# Patient Record
Sex: Female | Born: 1995
Health system: Southern US, Community
[De-identification: ages and names within clinical notes are randomized; demographics above are authoritative.]

## PROBLEM LIST (undated history)

## (undated) ENCOUNTER — Inpatient Hospital Stay (HOSPITAL_COMMUNITY): Payer: Self-pay

## (undated) ENCOUNTER — Emergency Department (HOSPITAL_COMMUNITY)

## (undated) ENCOUNTER — Emergency Department (HOSPITAL_COMMUNITY): Admission: EM | Payer: PRIVATE HEALTH INSURANCE | Source: Home / Self Care

## (undated) DIAGNOSIS — N83209 Unspecified ovarian cyst, unspecified side: Secondary | ICD-10-CM

## (undated) DIAGNOSIS — B999 Unspecified infectious disease: Secondary | ICD-10-CM

## (undated) DIAGNOSIS — F419 Anxiety disorder, unspecified: Secondary | ICD-10-CM

## (undated) DIAGNOSIS — R51 Headache: Secondary | ICD-10-CM

## (undated) DIAGNOSIS — A749 Chlamydial infection, unspecified: Secondary | ICD-10-CM

## (undated) DIAGNOSIS — N361 Urethral diverticulum: Secondary | ICD-10-CM

## (undated) DIAGNOSIS — N390 Urinary tract infection, site not specified: Secondary | ICD-10-CM

## (undated) DIAGNOSIS — J309 Allergic rhinitis, unspecified: Secondary | ICD-10-CM

## (undated) HISTORY — DX: Headache: R51

## (undated) HISTORY — PX: FRACTURE SURGERY: SHX138

## (undated) HISTORY — DX: Allergic rhinitis, unspecified: J30.9

---
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
## 2017-01-28 ENCOUNTER — Encounter: Payer: Self-pay | Admitting: Obstetrics

## 2017-01-28 ENCOUNTER — Other Ambulatory Visit (HOSPITAL_COMMUNITY)
Admission: RE | Admit: 2017-01-28 | Discharge: 2017-01-28 | Disposition: A | Discharge status: Home / Self Care | Payer: Commercial Managed Care - PPO | Source: Ambulatory Visit | Attending: Obstetrics | Admitting: Obstetrics

## 2017-01-28 ENCOUNTER — Ambulatory Visit (INDEPENDENT_AMBULATORY_CARE_PROVIDER_SITE_OTHER): Payer: Commercial Managed Care - PPO | Admitting: Obstetrics

## 2017-01-28 VITALS — BP 115/72 | HR 105 | Wt 139.4 lb

## 2017-01-28 DIAGNOSIS — B373 Candidiasis of vulva and vagina: Secondary | ICD-10-CM | POA: Diagnosis not present

## 2017-01-28 DIAGNOSIS — Z3401 Encounter for supervision of normal first pregnancy, first trimester: Secondary | ICD-10-CM

## 2017-01-28 DIAGNOSIS — Z113 Encounter for screening for infections with a predominantly sexual mode of transmission: Secondary | ICD-10-CM | POA: Diagnosis not present

## 2017-01-28 DIAGNOSIS — Z3A13 13 weeks gestation of pregnancy: Secondary | ICD-10-CM | POA: Insufficient documentation

## 2017-01-28 DIAGNOSIS — Z3402 Encounter for supervision of normal first pregnancy, second trimester: Secondary | ICD-10-CM | POA: Diagnosis present

## 2017-01-28 DIAGNOSIS — B9689 Other specified bacterial agents as the cause of diseases classified elsewhere: Secondary | ICD-10-CM | POA: Diagnosis not present

## 2017-01-28 DIAGNOSIS — N898 Other specified noninflammatory disorders of vagina: Secondary | ICD-10-CM

## 2017-01-28 DIAGNOSIS — O23592 Infection of other part of genital tract in pregnancy, second trimester: Secondary | ICD-10-CM | POA: Insufficient documentation

## 2017-01-28 DIAGNOSIS — Z3400 Encounter for supervision of normal first pregnancy, unspecified trimester: Secondary | ICD-10-CM | POA: Insufficient documentation

## 2017-01-28 DIAGNOSIS — O9989 Other specified diseases and conditions complicating pregnancy, childbirth and the puerperium: Secondary | ICD-10-CM | POA: Diagnosis not present

## 2017-01-28 NOTE — Progress Notes (Signed)
Patient is still suffering from nausea and vomiting. Patient has itching- ? Yeast.
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
## 2017-01-29 LAB — CERVICOVAGINAL ANCILLARY ONLY
BACTERIAL VAGINITIS: NEGATIVE
CHLAMYDIA, DNA PROBE: NEGATIVE
Candida vaginitis: POSITIVE — AB
NEISSERIA GONORRHEA: NEGATIVE
TRICH (WINDOWPATH): NEGATIVE

## 2017-01-30 ENCOUNTER — Other Ambulatory Visit: Payer: Self-pay | Admitting: Obstetrics

## 2017-01-30 DIAGNOSIS — B373 Candidiasis of vulva and vagina: Secondary | ICD-10-CM

## 2017-01-30 DIAGNOSIS — B3731 Acute candidiasis of vulva and vagina: Secondary | ICD-10-CM

## 2017-01-30 MED ORDER — TERCONAZOLE 0.8 % VA CREA: 1.0000 | TOPICAL_CREAM | Freq: Every day | VAGINAL | 0 refills | Status: DC

## 2017-02-02 LAB — PRENATAL PROFILE I(LABCORP)
Antibody Screen: NEGATIVE
Basophils Absolute: 0 10*3/uL (ref 0.0–0.2)
Basos: 0 %
EOS (ABSOLUTE): 0.1 10*3/uL (ref 0.0–0.4)
Eos: 1 %
HEMOGLOBIN: 12.6 g/dL (ref 11.1–15.9)
HEP B S AG: NEGATIVE
Hematocrit: 37.9 % (ref 34.0–46.6)
Immature Grans (Abs): 0 10*3/uL (ref 0.0–0.1)
Immature Granulocytes: 0 %
LYMPHS: 14 %
Lymphocytes Absolute: 0.9 10*3/uL (ref 0.7–3.1)
MCH: 29.7 pg (ref 26.6–33.0)
MCHC: 33.2 g/dL (ref 31.5–35.7)
MCV: 89 fL (ref 79–97)
MONOS ABS: 0.5 10*3/uL (ref 0.1–0.9)
Monocytes: 7 %
NEUTROS PCT: 78 %
Neutrophils Absolute: 5.1 10*3/uL (ref 1.4–7.0)
Platelets: 216 10*3/uL (ref 150–379)
RBC: 4.24 x10E6/uL (ref 3.77–5.28)
RDW: 13.9 % (ref 12.3–15.4)
RH TYPE: POSITIVE
RPR: NONREACTIVE
Rubella Antibodies, IGG: 1.3 index (ref >0.99)
WBC: 6.6 10*3/uL (ref 3.4–10.8)

## 2017-02-02 LAB — HEMOGLOBINOPATHY EVALUATION
HEMOGLOBIN F QUANTITATION: 0 % (ref 0.0–2.0)
HGB A: 97.3 % (ref 96.4–98.8)
HGB C: 0 %
HGB S: 0 %
HGB VARIANT: 0 %
Hemoglobin A2 Quantitation: 2.7 % (ref 1.8–3.2)

## 2017-02-02 LAB — VARICELLA ZOSTER ANTIBODY, IGG: Varicella zoster IgG: 646 index (ref >165)

## 2017-02-02 LAB — VITAMIN D 25 HYDROXY (VIT D DEFICIENCY, FRACTURES): VIT D 25 HYDROXY: 10.9 ng/mL — AB (ref 30.0–100.0)

## 2017-02-02 LAB — HIV ANTIBODY (ROUTINE TESTING W REFLEX): HIV SCREEN 4TH GENERATION: NONREACTIVE

## 2017-02-04 ENCOUNTER — Other Ambulatory Visit: Payer: Self-pay | Admitting: Obstetrics

## 2017-02-04 ENCOUNTER — Telehealth: Payer: Self-pay | Admitting: *Deleted

## 2017-02-04 DIAGNOSIS — E559 Vitamin D deficiency, unspecified: Secondary | ICD-10-CM

## 2017-02-04 MED ORDER — VITAMIN D 50 MCG (2000 UT) PO CAPS: 1.0000 | ORAL_CAPSULE | Freq: Every day | ORAL | 5 refills | Status: DC

## 2017-02-04 NOTE — Telephone Encounter (Signed)
Pt was made aware of lab results.  Pt states she was to have Nausea Rx and Zantac Rx refilled. Rx's have not been reordered.    Please send refills for pt.

## 2017-02-05 ENCOUNTER — Other Ambulatory Visit: Payer: Self-pay | Admitting: Obstetrics

## 2017-02-05 DIAGNOSIS — O219 Vomiting of pregnancy, unspecified: Secondary | ICD-10-CM

## 2017-02-05 DIAGNOSIS — K219 Gastro-esophageal reflux disease without esophagitis: Secondary | ICD-10-CM

## 2017-02-05 MED ORDER — RANITIDINE HCL 150 MG PO TABS: 150.0000 mg | ORAL_TABLET | Freq: Two times a day (BID) | ORAL | 11 refills | Status: DC

## 2017-02-05 MED ORDER — DOXYLAMINE-PYRIDOXINE 10-10 MG PO TBEC: DELAYED_RELEASE_TABLET | ORAL | 5 refills | Status: DC

## 2017-02-12 ENCOUNTER — Encounter: Payer: Commercial Managed Care - PPO | Admitting: Obstetrics

## 2017-02-16 NOTE — Progress Notes (Signed)
Left message on VM to call office.

## 2017-02-20 ENCOUNTER — Encounter: Payer: Self-pay | Admitting: Obstetrics and Gynecology

## 2017-02-20 DIAGNOSIS — Z87891 Personal history of nicotine dependence: Secondary | ICD-10-CM | POA: Diagnosis not present

## 2017-02-20 DIAGNOSIS — Z79899 Other long term (current) drug therapy: Secondary | ICD-10-CM | POA: Insufficient documentation

## 2017-02-20 DIAGNOSIS — Z3A17 17 weeks gestation of pregnancy: Secondary | ICD-10-CM | POA: Diagnosis not present

## 2017-02-20 DIAGNOSIS — R112 Nausea with vomiting, unspecified: Secondary | ICD-10-CM | POA: Diagnosis present

## 2017-02-20 LAB — COMPREHENSIVE METABOLIC PANEL
ALBUMIN: 3.8 g/dL (ref 3.5–5.0)
ALK PHOS: 58 U/L (ref 38–126)
ALT: 10 U/L — ABNORMAL LOW (ref 14–54)
AST: 17 U/L (ref 15–41)
Anion gap: 9 (ref 5–15)
BUN: 8 mg/dL (ref 6–20)
CALCIUM: 9.6 mg/dL (ref 8.9–10.3)
CHLORIDE: 103 mmol/L (ref 101–111)
CO2: 23 mmol/L (ref 22–32)
CREATININE: 0.5 mg/dL (ref 0.44–1.00)
GFR calc Af Amer: >60 mL/min (ref >60)
GFR calc non Af Amer: >60 mL/min (ref >60)
GLUCOSE: 78 mg/dL (ref 65–99)
Potassium: 3.7 mmol/L (ref 3.5–5.1)
SODIUM: 135 mmol/L (ref 135–145)
Total Bilirubin: 0.9 mg/dL (ref 0.3–1.2)
Total Protein: 7.1 g/dL (ref 6.5–8.1)

## 2017-02-20 LAB — URINALYSIS, ROUTINE W REFLEX MICROSCOPIC
BILIRUBIN URINE: NEGATIVE
Glucose, UA: NEGATIVE mg/dL
Hgb urine dipstick: NEGATIVE
KETONES UR: 80 mg/dL — AB
Nitrite: NEGATIVE
PH: 6 (ref 5.0–8.0)
Protein, ur: 100 mg/dL — AB
SPECIFIC GRAVITY, URINE: 1.03 (ref 1.005–1.030)

## 2017-02-20 LAB — CBC
HCT: 34.3 % — ABNORMAL LOW (ref 36.0–46.0)
Hemoglobin: 11.9 g/dL — ABNORMAL LOW (ref 12.0–15.0)
MCH: 29.7 pg (ref 26.0–34.0)
MCHC: 34.7 g/dL (ref 30.0–36.0)
MCV: 85.5 fL (ref 78.0–100.0)
PLATELETS: 244 10*3/uL (ref 150–400)
RBC: 4.01 MIL/uL (ref 3.87–5.11)
RDW: 13.9 % (ref 11.5–15.5)
WBC: 6.7 10*3/uL (ref 4.0–10.5)

## 2017-02-20 LAB — LIPASE, BLOOD: Lipase: 24 U/L (ref 11–51)

## 2017-02-20 MED ORDER — ONDANSETRON HCL 4 MG/2ML IJ SOLN: 4.0000 mg | Freq: Once | INTRAMUSCULAR | Status: DC

## 2017-02-20 MED ORDER — SODIUM CHLORIDE 0.9 % IV SOLN
1000.0000 mL | INTRAVENOUS | Status: DC
  Administered 2017-02-21: 1000 mL via INTRAVENOUS

## 2017-02-20 MED ORDER — SODIUM CHLORIDE 0.9 % IV BOLUS (SEPSIS)
1000.0000 mL | Freq: Once | INTRAVENOUS | Status: AC
  Administered 2017-02-21: 1000 mL via INTRAVENOUS

## 2017-02-20 NOTE — ED Triage Notes (Signed)
Pt states that she is [redacted] weeks pregnant and continues to experience nausea and vomiting and heartburn.  She states that she vomited 12-15 times today.  Pt has been having pregnancy related nausea and vomiting intermittently this pregnancy.  This episode has been going on for 3 days.  Pt also reports heartburn.  Pt can feel the baby move, she denies any contractions or leaking of fluids vagnially.

## 2017-02-21 ENCOUNTER — Emergency Department (HOSPITAL_COMMUNITY)
Admission: EM | Admit: 2017-02-21 | Discharge: 2017-02-21 | Disposition: A | Discharge status: Home / Self Care | Payer: Commercial Managed Care - PPO | Attending: Emergency Medicine | Admitting: Emergency Medicine

## 2017-02-21 DIAGNOSIS — R112 Nausea with vomiting, unspecified: Secondary | ICD-10-CM

## 2017-02-21 MED ORDER — ALUM & MAG HYDROXIDE-SIMETH 200-200-20 MG/5ML PO SUSP
30.0000 mL | Freq: Once | ORAL | Status: AC
  Administered 2017-02-21: 30 mL via ORAL
  Filled 2017-02-21: qty 30

## 2017-02-21 MED ORDER — METOCLOPRAMIDE HCL 5 MG/ML IJ SOLN
10.0000 mg | Freq: Once | INTRAMUSCULAR | Status: AC
  Administered 2017-02-21: 10 mg via INTRAVENOUS
  Filled 2017-02-21: qty 2

## 2017-02-21 NOTE — ED Notes (Signed)
Pt stating she wants to leave, despite not having fetal heart tones assessed.

## 2017-02-21 NOTE — ED Notes (Signed)
Gave pt some ice chips and saltine crackers at this time

## 2017-02-21 NOTE — ED Provider Notes (Signed)
TIME SEEN: 1:51 AM  CHIEF COMPLAINT: Nausea, vomiting, heartburn  HPI: Patient is a 21 year old who reports that she is a G1 P0 who is followed by Dr. Clearance Coots who is approximately [redacted] weeks pregnant with an estimated due date of February 3 who presents emergency Department nausea, vomiting for the past 3 days. No diarrhea. Has had some heartburn. Describes the pain as a sharp, burning pain that radiates between her right and left chest and is intermittent. No activating relieving factors. No shortness of breath. No pleuritic pain. She states she is not having a vaginal bleeding, discharge, leaking fluid, contractions. No dysuria or hematuria. She is feeling her baby move. No history of abdominal surgeries. No history of PE or DVT.  ROS: See HPI Constitutional: no fever  Eyes: no drainage  ENT: no runny nose   Cardiovascular:  no chest pain  Resp: no SOB  GI:  vomiting GU: no dysuria Integumentary: no rash  Allergy: no hives  Musculoskeletal: no leg swelling  Neurological: no slurred speech ROS otherwise negative  PAST MEDICAL HISTORY/PAST SURGICAL HISTORY:  Past Medical History:  Diagnosis Date  . Allergic rhinitis    seasonal  . Anxiety   . Chlamydia   . Headache(784.0)   . Infection    UTI    MEDICATIONS:  Prior to Admission medications   Medication Sig Start Date End Date Taking? Authorizing Provider  Cholecalciferol (VITAMIN D) 2000 units CAPS Take 1 capsule (2,000 Units total) by mouth daily. 02/04/17   Brock Bad, MD  Doxylamine-Pyridoxine (DICLEGIS) 10-10 MG TBEC 1 tab in AM, 1 tab mid afternoon 2 tabs at bedtime. Max dose 4 tabs daily. 02/05/17   Brock Bad, MD  promethazine (PHENERGAN) 25 MG tablet Take 1 tablet (25 mg total) by mouth every 6 (six) hours as needed for nausea or vomiting. Patient not taking: Reported on 01/28/2017 12/29/16   Judeth Horn, NP  ranitidine (ZANTAC) 150 MG tablet Take 1 tablet (150 mg total) by mouth 2 (two) times daily. 02/05/17    Brock Bad, MD  terconazole (TERAZOL 3) 0.8 % vaginal cream Place 1 applicator vaginally at bedtime. 01/30/17   Brock Bad, MD    ALLERGIES:  No Known Allergies  SOCIAL HISTORY:  Social History  Substance Use Topics  . Smoking status: Former Smoker    Types: E-cigarettes, Cigarettes  . Smokeless tobacco: Never Used     Comment: June 2018  . Alcohol use No     Comment: rare    FAMILY HISTORY: Family History  Problem Relation Age of Onset  . Migraines Mother   . Migraines Maternal Grandmother   . Cancer Maternal Grandmother        throat  . Diabetes Paternal Grandfather   . Cancer Paternal Grandfather        blood cancer    EXAM: BP 103/61   Pulse (!) 101   Temp 98.1 F (36.7 C) (Oral)   Resp 16   Wt 62.3 kg (137 lb 6.4 oz)   LMP 10/23/2016 (Approximate)   SpO2 98%  CONSTITUTIONAL: Alert and oriented and responds appropriately to questions. Well-appearing; well-nourished HEAD: Normocephalic EYES: Conjunctivae clear, pupils appear equal, EOMI ENT: normal nose; moist mucous membranes NECK: Supple, no meningismus, no nuchal rigidity, no LAD  CARD: RRR; S1 and S2 appreciated; no murmurs, no clicks, no rubs, no gallops RESP: Normal chest excursion without splinting or tachypnea; breath sounds clear and equal bilaterally; no wheezes, no rhonchi, no rales, no hypoxia  or respiratory distress, speaking full sentences ABD/GI: Normal bowel sounds; non-distended; soft, non-tender, no rebound, no guarding, no peritoneal signs, no hepatosplenomegaly, uterus noted above the pubic symphysis but below the umbilicus BACK:  The back appears normal and is non-tender to palpation, there is no CVA tenderness EXT: Normal ROM in all joints; non-tender to palpation; no edema; normal capillary refill; no cyanosis, no calf tenderness or swelling    SKIN: Normal color for age and race; warm; no rash NEURO: Moves all extremities equally PSYCH: The patient's mood and manner are  appropriate. Grooming and personal hygiene are appropriate.  MEDICAL DECISION MAKING: Patient here with nausea and vomiting. Abdominal exam benign. Complains of heartburn. We'll give Reglan, IV fluids and Maalox. She does appear dehydrated on exam. Doubt appendicitis, colitis, bowel obstruction, perforation, diverticulitis. She has no GU complaints. She is feeling her baby move. Labs ordered in triage are unremarkable. She does have large ketones in her urine but no urinary tract infection. We'll give 2 L of fluids and reassess.  ED PROGRESS: Plan was to reassess patient after IV hydration. She was eating and drinking in the room prior to receiving any antiemetics. Unfortunately patient eloped prior to me being able to reassess her and check fetal heart tones.  She did not inform nursing staff that she was going to leave. It was explained to patient that there was delay in reassessment because I was with multiple critical patients. Patient was not provided any prescriptions or discharge information. She does have outpatient OB/GYN follow-up.   I reviewed all nursing notes, vitals, pertinent previous records, EKGs, lab and urine results, imaging (as available).      Ward, Layla Maw, DO 02/21/17 [phone]

## 2017-02-21 NOTE — ED Notes (Signed)
Pt states that she continues to feel nauseated and vomtied x4 out in the waiting area

## 2017-02-21 NOTE — ED Notes (Signed)
Pt left, d/c papers not received yet. Dr. Elesa Massed notified.

## 2017-02-21 NOTE — ED Notes (Signed)
Attempt to doppler fetal heart tones.  Unable, EDP notified and [place] placed at the bedside.  Pt states that she can continue to feel baby move (fluttering)

## 2017-02-25 ENCOUNTER — Encounter: Payer: Self-pay | Admitting: Obstetrics

## 2017-02-25 ENCOUNTER — Ambulatory Visit (INDEPENDENT_AMBULATORY_CARE_PROVIDER_SITE_OTHER): Payer: Commercial Managed Care - PPO | Admitting: Obstetrics

## 2017-02-25 VITALS — BP 115/73 | HR 118 | Wt 137.8 lb

## 2017-02-25 DIAGNOSIS — O219 Vomiting of pregnancy, unspecified: Secondary | ICD-10-CM

## 2017-02-25 DIAGNOSIS — Z3400 Encounter for supervision of normal first pregnancy, unspecified trimester: Secondary | ICD-10-CM

## 2017-02-25 DIAGNOSIS — Z3402 Encounter for supervision of normal first pregnancy, second trimester: Secondary | ICD-10-CM

## 2017-02-25 DIAGNOSIS — G43109 Migraine with aura, not intractable, without status migrainosus: Secondary | ICD-10-CM

## 2017-02-25 MED ORDER — VITAMIN B-6 50 MG PO TABS: 50.0000 mg | ORAL_TABLET | Freq: Two times a day (BID) | ORAL | 5 refills | Status: DC

## 2017-02-25 MED ORDER — DOXYLAMINE SUCCINATE (SLEEP) 25 MG PO TABS: 25.0000 mg | ORAL_TABLET | Freq: Two times a day (BID) | ORAL | 5 refills | Status: DC

## 2017-02-25 NOTE — Patient Instructions (Signed)
Nausea and Vomiting, Adult Feeling sick to your stomach (nausea) means that your stomach is upset or you feel like you have to throw up (vomit). Feeling more and more sick to your stomach can lead to throwing up. Throwing up happens when food and liquid from your stomach are thrown up and out the mouth. Throwing up can make you feel weak and cause you to get dehydrated. Dehydration can make you tired and thirsty, make you have a dry mouth, and make it so you pee (urinate) less often. Older adults and people with other diseases or a weak defense system (immune system) are at higher risk for dehydration. If you feel sick to your stomach or if you throw up, it is important to follow instructions from your doctor about how to take care of yourself. Follow these instructions at home: Eating and drinking Follow these instructions as told by your doctor:  Take an oral rehydration solution (ORS). This is a drink that is sold at pharmacies and stores.  Drink clear fluids in small amounts as you are able, such as: ? Water. ? Ice chips. ? Diluted fruit juice. ? Low-calorie sports drinks.  Eat bland, easy-to-digest foods in small amounts as you are able, such as: ? Bananas. ? Applesauce. ? Rice. ? Low-fat (lean) meats. ? Toast. ? Crackers.  Avoid fluids that have a lot of sugar or caffeine in them.  Avoid alcohol.  Avoid spicy or fatty foods.  General instructions  Drink enough fluid to keep your pee (urine) clear or pale yellow.  Wash your hands often. If you cannot use soap and water, use hand sanitizer.  Make sure that all people in your home wash their hands well and often.  Take over-the-counter and prescription medicines only as told by your doctor.  Rest at home while you get better.  Watch your condition for any changes.  Breathe slowly and deeply when you feel sick to your stomach.  Keep all follow-up visits as told by your doctor. This is important. Contact a doctor  if:  You have a fever.  You cannot keep fluids down.  Your symptoms get worse.  You have new symptoms.  You feel sick to your stomach for more than two days.  You feel light-headed or dizzy.  You have a headache.  You have muscle cramps. Get help right away if:  You have pain in your chest, neck, arm, or jaw.  You feel very weak or you pass out (faint).  You throw up again and again.  You see blood in your throw-up.  Your throw-up looks like black coffee grounds.  You have bloody or black poop (stools) or poop that look like tar.  You have a very bad headache, a stiff neck, or both.  You have a rash.  You have very bad pain, cramping, or bloating in your belly (abdomen).  You have trouble breathing.  You are breathing very quickly.  Your heart is beating very quickly.  Your skin feels cold and clammy.  You feel confused.  You have pain when you pee.  You have signs of dehydration, such as: ? Dark pee, hardly any pee, or no pee. ? Cracked lips. ? Dry mouth. ? Sunken eyes. ? Sleepiness. ? Weakness. These symptoms may be an emergency. Do not wait to see if the symptoms will go away. Get medical help right away. Call your local emergency services (911 in the [place] Do not drive yourself to the hospital. This information is   not intended to replace advice given to you by your health care provider. Make sure you discuss any questions you have with your health care provider. Document Released: 12/02/2007 Document Revised: 01/03/2016 Document Reviewed: 02/19/2015 Elsevier Interactive Patient Education  2018 [employer].  Hyperemesis Gravidarum Hyperemesis gravidarum is a severe form of nausea and vomiting that happens during pregnancy. Hyperemesis is worse than morning sickness. It may cause you to have nausea or vomiting all day for many days. It may keep you from eating and drinking enough food and liquids. Hyperemesis usually occurs during the first half (the  first 20 weeks) of pregnancy. It often goes away once a woman is in her second half of pregnancy. However, sometimes hyperemesis continues through an entire pregnancy. What are the causes? The cause of this condition is not known. It may be related to changes in chemicals (hormones) in the body during pregnancy, such as the high level of pregnancy hormone (human chorionic gonadotropin) or the increase in the female sex hormone (estrogen). What are the signs or symptoms? Symptoms of this condition include:  Severe nausea and vomiting.  Nausea that does not go away.  Vomiting that does not allow you to keep any food down.  Weight loss.  Body fluid loss (dehydration).  Having no desire to eat, or not liking food that you have previously enjoyed.  How is this diagnosed? This condition may be diagnosed based on:  A physical exam.  Your medical history.  Your symptoms.  Blood tests.  Urine tests.  How is this treated? This condition may be managed with medicine. If medicines to do not help relieve nausea and vomiting, you may need to receive fluids through an IV tube at the hospital. Follow these instructions at home:  Take over-the-counter and prescription medicines only as told by your health care provider.  Avoid iron pills and multivitamins that contain iron for the first 3-4 months of pregnancy. If you take prescription iron pills, do not stop taking them unless your health care provider approves.  Take the following actions to help prevent nausea and vomiting: ? In the morning, before getting out of bed, try eating a couple of dry crackers or a piece of toast. ? Avoid foods and smells that upset your stomach. Fatty and spicy foods may make nausea worse. ? Eat 5-6 small meals a day. ? Do not drink fluids while eating meals. Drink between meals. ? Eat or suck on things that have ginger in them. Ginger can help relieve nausea. ? Avoid food preparation. The smell of food can  spoil your appetite or trigger nausea.  Follow instructions from your health care provider about eating or drinking restrictions.  For snacks, eat high-protein foods, such as cheese.  Keep all follow-up and pre-birth (prenatal) visits as told by your health care provider. This is important. Contact a health care provider if:  You have pain in your abdomen.  You have a severe headache.  You have vision problems.  You are losing weight. Get help right away if:  You cannot drink fluids without vomiting.  You vomit blood.  You have constant nausea and vomiting.  You are very weak.  You are very thirsty.  You feel dizzy.  You faint.  You have a fever or other symptoms that last for more than 2-3 days.  You have a fever and your symptoms suddenly get worse. Summary  Hyperemesis gravidarum is a severe form of nausea and vomiting that happens during pregnancy.  Making  some changes to your eating habits may help relieve nausea and vomiting.  This condition may be managed with medicine.  If medicines to do not help relieve nausea and vomiting, you may need to receive fluids through an IV tube at the hospital. This information is not intended to replace advice given to you by your health care provider. Make sure you discuss any questions you have with your health care provider. Document Released: 06/15/2005 Document Revised: 02/12/2016 Document Reviewed: 02/12/2016 Elsevier Interactive Patient Education  2017 [employer]  Eating Plan for Hyperemesis Gravidarum Hyperemesis gravidarum is a severe form of morning sickness. Because this condition causes severe nausea and vomiting, it can lead to dehydration, malnutrition, and weight loss. One way to lessen the symptoms of nausea and vomiting is to follow the eating plan for hyperemesis gravidarum. It is often used along with prescribed medicines to control your symptoms. What can I do to relieve my symptoms? Listen to your  body. Everyone is different and has different preferences. Find what works best for you. Take any of the following actions that are helpful to you:  Eat and drink slowly.  Eat 5-6 small meals daily instead of 3 large meals.  Eat crackers before you get out of bed in the morning.  Try having a snack in the middle of the night.  Starchy foods are usually tolerated well. Examples include cereal, toast, bread, potatoes, pasta, rice, and pretzels.  Ginger may help with nausea. Add  tsp ground ginger to hot tea or choose ginger tea.  Try drinking 100% fruit juice or an electrolyte drink. An electrolyte drink contains sodium, potassium, and chloride.  Continue to take your prenatal vitamins as told by your health care provider. If you are having trouble taking your prenatal vitamins, talk with your health care provider about different options.  Include at least 1 serving of protein with your meals and snacks. Protein options include meats or poultry, beans, nuts, eggs, and yogurt. Try eating a protein-rich snack before bed. Examples of these snacks include cheese and crackers or half of a peanut butter or [place] sandwich.  Consider eliminating foods that trigger your symptoms. These may include spicy foods, coffee, high-fat foods, very sweet foods, and acidic foods.  Try meals that have more protein combined with bland, salty, lower-fat, and dry foods, such as nuts, seeds, pretzels, crackers, and cereal.  Talk with your healthcare provider about starting a supplement of vitamin B6.  Have fluids that are cold, clear, and carbonated or sour. Examples include lemonade, ginger ale, lemon-lime soda, ice water, and sparkling water.  Try lemon or mint tea.  Try brushing your teeth or using a mouth rinse after meals.  What should I avoid to reduce my symptoms? Avoiding some of the following things may help reduce your symptoms.  Foods with strong smells. Try eating meals in well-ventilated areas  that are free of odors.  Drinking water or other beverages with meals. Try not to drink anything during the 30 minutes before and after your meals.  Drinking more than 1 cup of fluid at a time. Sometimes using a straw helps.  Fried or high-fat foods, such as butter and cream sauces.  Spicy foods.  Skipping meals as best as you can. Nausea can be more intense on an empty stomach. If you cannot tolerate food at that time, do not force it. Try sucking on ice chips or other frozen items, and make up for missed calories later.  Lying down within 2  hours after eating.  Environmental triggers. These may include smoky rooms, closed spaces, rooms with strong smells, warm or humid places, overly loud and noisy rooms, and rooms with motion or flickering lights.  Quick and sudden changes in your movement.  This information is not intended to replace advice given to you by your health care provider. Make sure you discuss any questions you have with your health care provider. Document Released: 04/12/2007 Document Revised: 02/12/2016 Document Reviewed: 01/14/2016 Elsevier Interactive Patient Education  [employer].
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
## 2017-02-25 NOTE — Progress Notes (Signed)
Patient reports nausea/vomiting all day. Patient used the medication from the hospital and it has not helped the nausea at all.

## 2017-03-03 LAB — AFP TETRA
DIA Mom Value: 0.83
DIA Value (EIA): 146.53 pg/mL
DSR (By Age)    1 IN: 1153
DSR (SECOND TRIMESTER) 1 IN: 10000
Gestational Age: 17.9 WEEKS
MSAFP MOM: 1.17
MSAFP: 52.3 ng/mL
MSHCG Mom: 1.54
MSHCG: 45516 m[IU]/mL
Maternal Age At EDD: 20.7 yr
Osb Risk: 7137
Test Results:: NEGATIVE
UE3 MOM: 0.89
UE3 VALUE: 1.12 ng/mL
Weight: 137 [lb_av]

## 2017-03-12 ENCOUNTER — Other Ambulatory Visit: Payer: Self-pay | Admitting: Obstetrics

## 2017-03-12 ENCOUNTER — Ambulatory Visit (HOSPITAL_COMMUNITY)
Admission: RE | Admit: 2017-03-12 | Discharge: 2017-03-12 | Disposition: A | Discharge status: Home / Self Care | Payer: PRIVATE HEALTH INSURANCE | Source: Ambulatory Visit | Attending: Obstetrics | Admitting: Obstetrics

## 2017-03-12 DIAGNOSIS — Z3A20 20 weeks gestation of pregnancy: Secondary | ICD-10-CM

## 2017-03-12 DIAGNOSIS — Z369 Encounter for antenatal screening, unspecified: Secondary | ICD-10-CM

## 2017-03-12 DIAGNOSIS — Z3400 Encounter for supervision of normal first pregnancy, unspecified trimester: Secondary | ICD-10-CM

## 2017-03-25 ENCOUNTER — Ambulatory Visit (INDEPENDENT_AMBULATORY_CARE_PROVIDER_SITE_OTHER): Payer: PRIVATE HEALTH INSURANCE | Admitting: Obstetrics & Gynecology

## 2017-03-25 VITALS — BP 128/74 | HR 113 | Wt 149.3 lb

## 2017-03-25 DIAGNOSIS — Z23 Encounter for immunization: Secondary | ICD-10-CM | POA: Diagnosis not present

## 2017-03-25 DIAGNOSIS — Z3400 Encounter for supervision of normal first pregnancy, unspecified trimester: Secondary | ICD-10-CM

## 2017-03-25 DIAGNOSIS — Z3402 Encounter for supervision of normal first pregnancy, second trimester: Secondary | ICD-10-CM
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
## 2017-03-25 NOTE — Progress Notes (Signed)
Patient reports feeling fetal movement, denies pain. Pt states that she is not currently taking PNV because they previously made her sick.

## 2017-04-21 ENCOUNTER — Encounter: Payer: PRIVATE HEALTH INSURANCE | Admitting: Obstetrics

## 2017-05-17 ENCOUNTER — Encounter: Payer: Self-pay | Admitting: Obstetrics

## 2017-05-17 ENCOUNTER — Other Ambulatory Visit: Payer: PRIVATE HEALTH INSURANCE

## 2017-05-17 ENCOUNTER — Ambulatory Visit (INDEPENDENT_AMBULATORY_CARE_PROVIDER_SITE_OTHER): Payer: PRIVATE HEALTH INSURANCE | Admitting: Obstetrics

## 2017-05-17 VITALS — BP 130/76 | HR 103 | Wt 162.6 lb

## 2017-05-17 DIAGNOSIS — Z23 Encounter for immunization: Secondary | ICD-10-CM | POA: Diagnosis not present

## 2017-05-17 DIAGNOSIS — Z3400 Encounter for supervision of normal first pregnancy, unspecified trimester: Secondary | ICD-10-CM

## 2017-05-17 DIAGNOSIS — Z3403 Encounter for supervision of normal first pregnancy, third trimester: Secondary | ICD-10-CM

## 2017-05-17 NOTE — Progress Notes (Signed)
Pt denies issues at this time 

## 2017-05-17 NOTE — Addendum Note (Signed)
Addended by: Lear NgMARTIN, Okie Bogacz L on: 05/17/2017 09:44 AM   Modules accepted: Orders
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
## 2017-05-18 LAB — GLUCOSE TOLERANCE, 2 HOURS W/ 1HR
GLUCOSE, FASTING: 67 mg/dL (ref 65–91)
Glucose, 1 hour: 88 mg/dL (ref 65–179)
Glucose, 2 hour: 76 mg/dL (ref 65–152)

## 2017-05-18 LAB — CBC
Hematocrit: 34.6 % (ref 34.0–46.6)
Hemoglobin: 11.9 g/dL (ref 11.1–15.9)
MCH: 31.3 pg (ref 26.6–33.0)
MCHC: 34.4 g/dL (ref 31.5–35.7)
MCV: 91 fL (ref 79–97)
PLATELETS: 213 10*3/uL (ref 150–379)
RBC: 3.8 x10E6/uL (ref 3.77–5.28)
RDW: 14.3 % (ref 12.3–15.4)
WBC: 9.7 10*3/uL (ref 3.4–10.8)

## 2017-05-18 LAB — RPR: RPR Ser Ql: NONREACTIVE

## 2017-05-18 LAB — HIV ANTIBODY (ROUTINE TESTING W REFLEX): HIV Screen 4th Generation wRfx: NONREACTIVE

## 2017-05-31 ENCOUNTER — Encounter: Payer: PRIVATE HEALTH INSURANCE | Admitting: Obstetrics

## 2017-06-29 NOTE — L&D Delivery Note (Signed)
Delivery Note At 2:40 AM a viable female was delivered via Vaginal, Spontaneous (Presentation: LOA  ).  APGAR: 7, 9; weight pending .   Placenta status: , .  Cord:  with the following complications: .  Cord pH: 7.15  The shoulders were a little slow forthcoming, so the posterior (left) axilla was grasped with my index finger, and the baby was rotated clockwise into the oblique diameter.  At this point, the (now) anterior shoulder was released, and the baby delivered.  At no time was any traction placed on the baby's head. The cord was milked towards the umbilicus d/t slow response to stimulation, clamped and cut. 40 units of pitocin diluted in 1000cc LR was infused rapidly IV.  The placenta separated spontaneously and delivered via CCT and maternal pushing effort.  It was inspected and appears to be intact with a 3 VC. Anesthesia:  epidural Episiotomy:   Lacerations:  none Suture Repair:  Est. Blood Loss (mL): 26  The above was performed by Sherran NeedsS. Abraham, MD under my direct supervision and guidance.    Mom to postpartum.  Baby to Couplet care / Skin to Skin.  CRESENZO-DISHMAN,Yaniah Thiemann 08/06/2017, 2:56 AM

## 2017-07-05 ENCOUNTER — Encounter: Payer: PRIVATE HEALTH INSURANCE | Admitting: Obstetrics

## 2017-07-06 ENCOUNTER — Encounter: Payer: Self-pay | Admitting: Obstetrics

## 2017-07-06 ENCOUNTER — Other Ambulatory Visit (HOSPITAL_COMMUNITY)
Admission: RE | Admit: 2017-07-06 | Discharge: 2017-07-06 | Disposition: A | Discharge status: Home / Self Care | Payer: PRIVATE HEALTH INSURANCE | Source: Ambulatory Visit | Attending: Obstetrics | Admitting: Obstetrics

## 2017-07-06 ENCOUNTER — Ambulatory Visit (INDEPENDENT_AMBULATORY_CARE_PROVIDER_SITE_OTHER): Payer: PRIVATE HEALTH INSURANCE | Admitting: Obstetrics

## 2017-07-06 VITALS — BP 107/70 | HR 108 | Wt 165.4 lb

## 2017-07-06 DIAGNOSIS — O368130 Decreased fetal movements, third trimester, not applicable or unspecified: Secondary | ICD-10-CM

## 2017-07-06 DIAGNOSIS — Z3400 Encounter for supervision of normal first pregnancy, unspecified trimester: Secondary | ICD-10-CM | POA: Insufficient documentation

## 2017-07-06 DIAGNOSIS — Z3403 Encounter for supervision of normal first pregnancy, third trimester: Secondary | ICD-10-CM

## 2017-07-06 LAB — OB RESULTS CONSOLE GC/CHLAMYDIA: GC PROBE AMP, GENITAL: NEGATIVE

## 2017-07-06 NOTE — Progress Notes (Signed)
Patient reports feeling a decrease in fetal movement from a couple of days ago. Pt reports feeling some pressure, denies contractions and bleeding.
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
## 2017-07-07 LAB — CERVICOVAGINAL ANCILLARY ONLY
CHLAMYDIA, DNA PROBE: NEGATIVE
Neisseria Gonorrhea: NEGATIVE

## 2017-07-08 LAB — STREP GP B NAA: STREP GROUP B AG: POSITIVE — AB

## 2017-07-13 ENCOUNTER — Encounter: Payer: PRIVATE HEALTH INSURANCE | Admitting: Obstetrics

## 2017-07-29 ENCOUNTER — Encounter: Payer: Self-pay | Admitting: Obstetrics

## 2017-07-29 ENCOUNTER — Other Ambulatory Visit: Payer: Self-pay

## 2017-07-29 ENCOUNTER — Ambulatory Visit (INDEPENDENT_AMBULATORY_CARE_PROVIDER_SITE_OTHER): Payer: PRIVATE HEALTH INSURANCE | Admitting: Obstetrics

## 2017-07-29 DIAGNOSIS — Z3403 Encounter for supervision of normal first pregnancy, third trimester: Secondary | ICD-10-CM

## 2017-07-29 DIAGNOSIS — Z3400 Encounter for supervision of normal first pregnancy, unspecified trimester: Secondary | ICD-10-CM
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
## 2017-07-30 ENCOUNTER — Telehealth (HOSPITAL_COMMUNITY): Payer: Self-pay | Admitting: *Deleted

## 2017-07-30 NOTE — Telephone Encounter (Signed)
Preadmission screen  

## 2017-08-03 ENCOUNTER — Telehealth (HOSPITAL_COMMUNITY): Payer: Self-pay | Admitting: *Deleted

## 2017-08-03 ENCOUNTER — Other Ambulatory Visit: Payer: Self-pay | Admitting: Obstetrics and Gynecology

## 2017-08-03 NOTE — Telephone Encounter (Signed)
Preadmission screen  

## 2017-08-05 ENCOUNTER — Encounter: Payer: PRIVATE HEALTH INSURANCE | Admitting: Obstetrics

## 2017-08-05 ENCOUNTER — Inpatient Hospital Stay (HOSPITAL_COMMUNITY): Payer: PRIVATE HEALTH INSURANCE | Admitting: Anesthesiology

## 2017-08-05 ENCOUNTER — Inpatient Hospital Stay (HOSPITAL_COMMUNITY)
Admission: AD | Admit: 2017-08-05 | Discharge: 2017-08-08 | DRG: 807 | Disposition: A | Discharge status: Home / Self Care | Payer: PRIVATE HEALTH INSURANCE | Source: Ambulatory Visit | Attending: Obstetrics & Gynecology | Admitting: Obstetrics & Gynecology

## 2017-08-05 ENCOUNTER — Encounter (HOSPITAL_COMMUNITY): Payer: Self-pay

## 2017-08-05 ENCOUNTER — Other Ambulatory Visit: Payer: Self-pay

## 2017-08-05 DIAGNOSIS — Z87891 Personal history of nicotine dependence: Secondary | ICD-10-CM

## 2017-08-05 DIAGNOSIS — G43009 Migraine without aura, not intractable, without status migrainosus: Secondary | ICD-10-CM | POA: Diagnosis present

## 2017-08-05 DIAGNOSIS — O99824 Streptococcus B carrier state complicating childbirth: Secondary | ICD-10-CM | POA: Diagnosis present

## 2017-08-05 DIAGNOSIS — O26893 Other specified pregnancy related conditions, third trimester: Secondary | ICD-10-CM | POA: Diagnosis present

## 2017-08-05 DIAGNOSIS — Z3400 Encounter for supervision of normal first pregnancy, unspecified trimester: Secondary | ICD-10-CM

## 2017-08-05 DIAGNOSIS — Z3A40 40 weeks gestation of pregnancy: Secondary | ICD-10-CM | POA: Diagnosis not present

## 2017-08-05 DIAGNOSIS — O480 Post-term pregnancy: Principal | ICD-10-CM | POA: Diagnosis present

## 2017-08-05 LAB — TYPE AND SCREEN
ABO/RH(D): A POS
ANTIBODY SCREEN: NEGATIVE

## 2017-08-05 LAB — ABO/RH: ABO/RH(D): A POS

## 2017-08-05 LAB — CBC
HCT: 35.6 % — ABNORMAL LOW (ref 36.0–46.0)
HEMOGLOBIN: 12.1 g/dL (ref 12.0–15.0)
MCH: 29.4 pg (ref 26.0–34.0)
MCHC: 34 g/dL (ref 30.0–36.0)
MCV: 86.6 fL (ref 78.0–100.0)
PLATELETS: 261 10*3/uL (ref 150–400)
RBC: 4.11 MIL/uL (ref 3.87–5.11)
RDW: 13.8 % (ref 11.5–15.5)
WBC: 11.3 10*3/uL — ABNORMAL HIGH (ref 4.0–10.5)

## 2017-08-05 MED ORDER — FENTANYL 2.5 MCG/ML BUPIVACAINE 1/10 % EPIDURAL INFUSION (WH - ANES)
INTRAMUSCULAR | Status: AC
  Filled 2017-08-05: qty 100

## 2017-08-05 MED ORDER — OXYCODONE-ACETAMINOPHEN 5-325 MG PO TABS: 2.0000 | ORAL_TABLET | ORAL | Status: DC | PRN

## 2017-08-05 MED ORDER — PHENYLEPHRINE 40 MCG/ML (10ML) SYRINGE FOR IV PUSH (FOR BLOOD PRESSURE SUPPORT): 80.0000 ug | PREFILLED_SYRINGE | INTRAVENOUS | Status: DC | PRN

## 2017-08-05 MED ORDER — OXYTOCIN 40 UNITS IN LACTATED RINGERS INFUSION - SIMPLE MED
1.0000 m[IU]/min | INTRAVENOUS | Status: DC
  Administered 2017-08-05: 2 m[IU]/min via INTRAVENOUS

## 2017-08-05 MED ORDER — LIDOCAINE HCL (PF) 1 % IJ SOLN
30.0000 mL | INTRAMUSCULAR | Status: DC | PRN
  Filled 2017-08-05: qty 30

## 2017-08-05 MED ORDER — PHENYLEPHRINE 40 MCG/ML (10ML) SYRINGE FOR IV PUSH (FOR BLOOD PRESSURE SUPPORT)
PREFILLED_SYRINGE | INTRAVENOUS | Status: AC
  Filled 2017-08-05: qty 20

## 2017-08-05 MED ORDER — LACTATED RINGERS IV SOLN: 500.0000 mL | INTRAVENOUS | Status: DC | PRN

## 2017-08-05 MED ORDER — FLEET ENEMA 7-19 GM/118ML RE ENEM: 1.0000 | ENEMA | RECTAL | Status: DC | PRN

## 2017-08-05 MED ORDER — LACTATED RINGERS IV SOLN: 500.0000 mL | Freq: Once | INTRAVENOUS | Status: DC

## 2017-08-05 MED ORDER — PENICILLIN G POTASSIUM 5000000 UNITS IJ SOLR
5.0000 10*6.[IU] | Freq: Once | INTRAVENOUS | Status: AC
  Administered 2017-08-05: 5 10*6.[IU] via INTRAVENOUS
  Filled 2017-08-05: qty 5

## 2017-08-05 MED ORDER — EPHEDRINE 5 MG/ML INJ: 10.0000 mg | INTRAVENOUS | Status: DC | PRN

## 2017-08-05 MED ORDER — OXYTOCIN BOLUS FROM INFUSION
500.0000 mL | Freq: Once | INTRAVENOUS | Status: AC
  Administered 2017-08-06: 500 mL via INTRAVENOUS

## 2017-08-05 MED ORDER — TERBUTALINE SULFATE 1 MG/ML IJ SOLN: 0.2500 mg | Freq: Once | INTRAMUSCULAR | Status: DC | PRN

## 2017-08-05 MED ORDER — ACETAMINOPHEN 325 MG PO TABS: 650.0000 mg | ORAL_TABLET | ORAL | Status: DC | PRN

## 2017-08-05 MED ORDER — SOD CITRATE-CITRIC ACID 500-334 MG/5ML PO SOLN: 30.0000 mL | ORAL | Status: DC | PRN

## 2017-08-05 MED ORDER — LIDOCAINE HCL (PF) 1 % IJ SOLN
INTRAMUSCULAR | Status: DC | PRN
  Administered 2017-08-05: 3 mL via EPIDURAL
  Administered 2017-08-05: 5 mL via EPIDURAL
  Administered 2017-08-05: 2 mL via EPIDURAL

## 2017-08-05 MED ORDER — DIPHENHYDRAMINE HCL 50 MG/ML IJ SOLN: 12.5000 mg | INTRAMUSCULAR | Status: DC | PRN

## 2017-08-05 MED ORDER — OXYCODONE-ACETAMINOPHEN 5-325 MG PO TABS: 1.0000 | ORAL_TABLET | ORAL | Status: DC | PRN

## 2017-08-05 MED ORDER — FENTANYL 2.5 MCG/ML BUPIVACAINE 1/10 % EPIDURAL INFUSION (WH - ANES)
14.0000 mL/h | INTRAMUSCULAR | Status: DC | PRN
  Administered 2017-08-05: 12 mL/h via EPIDURAL

## 2017-08-05 MED ORDER — FENTANYL CITRATE (PF) 100 MCG/2ML IJ SOLN
100.0000 ug | INTRAMUSCULAR | Status: DC | PRN
  Administered 2017-08-05 (×4): 100 ug via INTRAVENOUS
  Filled 2017-08-05 (×4): qty 2

## 2017-08-05 MED ORDER — ONDANSETRON HCL 4 MG/2ML IJ SOLN: 4.0000 mg | Freq: Four times a day (QID) | INTRAMUSCULAR | Status: DC | PRN

## 2017-08-05 MED ORDER — OXYTOCIN 40 UNITS IN LACTATED RINGERS INFUSION - SIMPLE MED
2.5000 [IU]/h | INTRAVENOUS | Status: DC
  Administered 2017-08-06: 2.5 [IU]/h via INTRAVENOUS
  Filled 2017-08-05: qty 1000

## 2017-08-05 MED ORDER — LACTATED RINGERS IV SOLN
INTRAVENOUS | Status: DC
  Administered 2017-08-05 – 2017-08-06 (×2): via INTRAVENOUS

## 2017-08-05 MED ORDER — PENICILLIN G POT IN DEXTROSE 60000 UNIT/ML IV SOLN
3.0000 10*6.[IU] | INTRAVENOUS | Status: DC
  Administered 2017-08-05 (×2): 3 10*6.[IU] via INTRAVENOUS
  Filled 2017-08-05 (×5): qty 50

## 2017-08-05 NOTE — Progress Notes (Signed)
Christina Kennedy is a 22 y.o. G1P0 at 2335w6d by LMP admitted for induction of labor due to Post dates. Due date 07/30/17.  Subjective: Patient doing well.   Objective: BP 94/80   Pulse (!) 105   Temp 98.9 F (37.2 C) (Oral)   Resp 16   Ht 5' (1.524 m)   Wt 78 kg (172 lb)   LMP 10/23/2016 (Approximate)   SpO2 98%   BMI 33.59 kg/m  No intake/output data recorded. No intake/output data recorded.  FHT:  FHR: 135 bpm, variability: moderate,  accelerations:  Present,  decelerations:  Absent UC:   Irregular SVE:   Dilation: 7.5 Effacement (%): 90 Station: -1 Exam by:: Christina Kennedy,Christina Kennedy  Labs: Lab Results  Component Value Date   WBC 11.3 (H) 08/05/2017   HGB 12.1 08/05/2017   HCT 35.6 (L) 08/05/2017   MCV 86.6 08/05/2017   PLT 261 08/05/2017    Assessment / Plan: Induction of labor due to postterm,  progressing well on pitocin  Labor: Pitocin @10miliunits /min. AROM @2146  by Christina Kennedy, Christina Kennedy Fetal Wellbeing:  Category I Pain Control:  per patient request I/D:  GBS pos - PCN Anticipated MOD:  NSVD  Christina ManisSherin Henessy Rohrer, DO PGY-1 08/05/2017, 9:52 PM

## 2017-08-05 NOTE — Anesthesia Pain Management Evaluation Note (Signed)
  CRNA Pain Management Visit Note  Patient: Christina DominoMelanie Kindig, 22 y.o., female  "Hello I am a member of the anesthesia team at [facility]. We have an anesthesia team available at all times to provide care throughout the hospital, including epidural management and anesthesia for C-section. I don't know your plan for the delivery whether it a natural birth, water birth, IV sedation, nitrous supplementation, doula or epidural, but we want to meet your pain goals."   1.Was your pain managed to your expectations on prior hospitalizations?   No prior hospitalizations  2.What is your expectation for pain management during this hospitalization?     IV pain meds  3.How can we help you reach that goal?   Record the patient's initial score and the patient's pain goal.   Pain: 8  Pain Goal: 9 [facility] wants you to be able to say your pain was always managed very well.  Laban EmperorMalinova,Dammon Makarewicz Hristova 08/05/2017

## 2017-08-05 NOTE — Progress Notes (Signed)
Christina Kennedy is a 22 y.o. G1P0 at 4572w6d by LMP admitted for induction of labor due to Post dates. Due date 07/30/17.  Subjective: Patient doing well, no concerns or questions at this time. Family and FOB at bedside.   Objective: BP (!) 117/59 (BP Location: Left Arm)   Pulse (!) 108   Temp 98.9 F (37.2 C) (Oral)   Resp 18   Ht 5' (1.524 m)   Wt 78 kg (172 lb)   LMP 10/23/2016 (Approximate)   SpO2 98%   BMI 33.59 kg/m  No intake/output data recorded. No intake/output data recorded.  FHT:  FHR: 130 bpm, variability: moderate,  accelerations:  Present,  decelerations:  Absent UC:   irregular, every 1-4 minutes SVE:   Dilation: 5.5 Effacement (%): 80 Station: -1 Exam by:: C. Goodman(verified by E. Foley, RN)  Labs: Lab Results  Component Value Date   WBC 11.3 (H) 08/05/2017   HGB 12.1 08/05/2017   HCT 35.6 (L) 08/05/2017   MCV 86.6 08/05/2017   PLT 261 08/05/2017    Assessment / Plan: Induction of labor due to postterm,  progressing well on pitocin  Labor: Pitocin @6miliunits /min. Will continue to increase per protocol. Plan to AROM Fetal Wellbeing:  Category I Pain Control:  per patient request I/D:  GBS pos - PCN Anticipated MOD:  NSVD  Christina ManisSherin Jarquis Walker, DO PGY-1 08/05/2017, 8:54 PM

## 2017-08-05 NOTE — Anesthesia Procedure Notes (Signed)
Epidural Patient location during procedure: OB Start time: 08/05/2017 11:34 PM End time: 08/05/2017 11:39 PM  Staffing Anesthesiologist: Cecile Hearingurk, Anetra Czerwinski Edward, MD Performed: anesthesiologist   Preanesthetic Checklist Completed: patient identified, pre-op evaluation, timeout performed, IV checked, risks and benefits discussed and monitors and equipment checked  Epidural Patient position: sitting Prep: DuraPrep Patient monitoring: blood pressure and continuous pulse ox Approach: midline Location: L3-L4 Injection technique: LOR air  Needle:  Needle type: Tuohy  Needle gauge: 17 G Needle length: 9 cm Needle insertion depth: 4 cm Catheter size: 19 Gauge Catheter at skin depth: 9 cm Test dose: negative and Other (1% Lidocaine)  Additional Notes Patient identified.  Risk benefits discussed including failed block, incomplete pain control, headache, nerve damage, paralysis, blood pressure changes, nausea, vomiting, reactions to medication both toxic or allergic, and postpartum back pain.  Patient expressed understanding and wished to proceed.  All questions were answered.  Sterile technique used throughout procedure and epidural site dressed with sterile barrier dressing. No paresthesia or other complications noted. The patient did not experience any signs of intravascular injection such as tinnitus or metallic taste in mouth nor signs of intrathecal spread such as rapid motor block. Please see nursing notes for vital signs. Reason for block:procedure for pain

## 2017-08-05 NOTE — MAU Note (Signed)
Pt has been contracting since 7am, 5 min apart. No LOF no bleeding +FM

## 2017-08-05 NOTE — Progress Notes (Signed)
Vitals:   08/05/17 1900 08/05/17 1918  BP: 130/90 (!) 117/59  Pulse: (!) 102 (!) 108  Resp: 18 18  Temp:  98.9 F (37.2 C)  SpO2:     Foley fell out, pit at 3 mu/min.  Ctx q 4-5 minutes, cx 5-6 cms. Continue present mgt.

## 2017-08-05 NOTE — Anesthesia Preprocedure Evaluation (Signed)
Anesthesia Evaluation  Patient identified by MRN, date of birth, ID band Patient awake    Reviewed: Allergy & Precautions, NPO status , Patient's Chart, lab work & pertinent test results  Airway Mallampati: II  TM Distance: >3 FB Neck ROM: Full    Dental  (+) Teeth Intact, Dental Advisory Given   Pulmonary former smoker,    Pulmonary exam normal breath sounds clear to auscultation       Cardiovascular Exercise Tolerance: Good negative cardio ROS Normal cardiovascular exam Rhythm:Regular Rate:Normal     Neuro/Psych  Headaches, Seizures -,  PSYCHIATRIC DISORDERS Anxiety    GI/Hepatic Neg liver ROS, GERD  Medicated,  Endo/Other  negative endocrine ROSObesity   Renal/GU negative Renal ROS     Musculoskeletal negative musculoskeletal ROS (+)   Abdominal   Peds  Hematology negative hematology ROS (+) Plt 261k   Anesthesia Other Findings Day of surgery medications reviewed with the patient.  Reproductive/Obstetrics (+) Pregnancy                             Anesthesia Physical Anesthesia Plan  ASA: II  Anesthesia Plan: Epidural   Post-op Pain Management:    Induction:   PONV Risk Score and Plan: 2 and Treatment may vary due to age or medical condition  Airway Management Planned:   Additional Equipment:   Intra-op Plan:   Post-operative Plan:   Informed Consent: I have reviewed the patients History and Physical, chart, labs and discussed the procedure including the risks, benefits and alternatives for the proposed anesthesia with the patient or authorized representative who has indicated his/her understanding and acceptance.   Dental advisory given  Plan Discussed with:   Anesthesia Plan Comments: (Patient identified. Risks/Benefits/Options discussed with patient including but not limited to bleeding, infection, nerve damage, paralysis, failed block, incomplete pain control,  headache, blood pressure changes, nausea, vomiting, reactions to medication both or allergic, itching and postpartum back pain. Confirmed with bedside nurse the patient's most recent platelet count. Confirmed with patient that they are not currently taking any anticoagulation, have any bleeding history or any family history of bleeding disorders. Patient expressed understanding and wished to proceed. All questions were answered. )        Anesthesia Quick Evaluation

## 2017-08-05 NOTE — MAU Note (Signed)
Urine in lab 
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
## 2017-08-06 ENCOUNTER — Encounter (HOSPITAL_COMMUNITY): Payer: Self-pay

## 2017-08-06 DIAGNOSIS — Z3A40 40 weeks gestation of pregnancy: Secondary | ICD-10-CM

## 2017-08-06 DIAGNOSIS — O99824 Streptococcus B carrier state complicating childbirth: Secondary | ICD-10-CM

## 2017-08-06 LAB — RPR: RPR Ser Ql: NONREACTIVE

## 2017-08-06 MED ORDER — METHYLERGONOVINE MALEATE 0.2 MG/ML IJ SOLN: 0.2000 mg | INTRAMUSCULAR | Status: DC | PRN

## 2017-08-06 MED ORDER — ACETAMINOPHEN 325 MG PO TABS
650.0000 mg | ORAL_TABLET | ORAL | Status: DC | PRN
  Administered 2017-08-06: 650 mg via ORAL
  Filled 2017-08-06: qty 2

## 2017-08-06 MED ORDER — DIBUCAINE 1 % RE OINT
1.0000 "application " | TOPICAL_OINTMENT | RECTAL | Status: DC | PRN
  Filled 2017-08-06: qty 28

## 2017-08-06 MED ORDER — TETANUS-DIPHTH-ACELL PERTUSSIS 5-2.5-18.5 LF-MCG/0.5 IM SUSP: 0.5000 mL | Freq: Once | INTRAMUSCULAR | Status: DC

## 2017-08-06 MED ORDER — SIMETHICONE 80 MG PO CHEW: 80.0000 mg | CHEWABLE_TABLET | ORAL | Status: DC | PRN

## 2017-08-06 MED ORDER — ONDANSETRON HCL 4 MG/2ML IJ SOLN: 4.0000 mg | INTRAMUSCULAR | Status: DC | PRN

## 2017-08-06 MED ORDER — PRENATAL MULTIVITAMIN CH
1.0000 | ORAL_TABLET | Freq: Every day | ORAL | Status: DC
  Administered 2017-08-06 – 2017-08-08 (×3): 1 via ORAL
  Filled 2017-08-06 (×3): qty 1

## 2017-08-06 MED ORDER — METHYLERGONOVINE MALEATE 0.2 MG PO TABS: 0.2000 mg | ORAL_TABLET | ORAL | Status: DC | PRN

## 2017-08-06 MED ORDER — DOCUSATE SODIUM 100 MG PO CAPS
100.0000 mg | ORAL_CAPSULE | Freq: Two times a day (BID) | ORAL | Status: DC
  Administered 2017-08-07 – 2017-08-08 (×4): 100 mg via ORAL
  Filled 2017-08-06 (×4): qty 1

## 2017-08-06 MED ORDER — FERROUS SULFATE 325 (65 FE) MG PO TABS
325.0000 mg | ORAL_TABLET | Freq: Two times a day (BID) | ORAL | Status: DC
  Administered 2017-08-06 – 2017-08-08 (×4): 325 mg via ORAL
  Filled 2017-08-06 (×5): qty 1

## 2017-08-06 MED ORDER — WITCH HAZEL-GLYCERIN EX PADS: 1.0000 "application " | MEDICATED_PAD | CUTANEOUS | Status: DC | PRN

## 2017-08-06 MED ORDER — IBUPROFEN 600 MG PO TABS
600.0000 mg | ORAL_TABLET | Freq: Four times a day (QID) | ORAL | Status: DC
  Administered 2017-08-06 – 2017-08-08 (×10): 600 mg via ORAL
  Filled 2017-08-06 (×10): qty 1

## 2017-08-06 MED ORDER — DIPHENHYDRAMINE HCL 25 MG PO CAPS: 25.0000 mg | ORAL_CAPSULE | Freq: Four times a day (QID) | ORAL | Status: DC | PRN

## 2017-08-06 MED ORDER — MEASLES, MUMPS & RUBELLA VAC ~~LOC~~ INJ
0.5000 mL | INJECTION | Freq: Once | SUBCUTANEOUS | Status: DC
  Filled 2017-08-06: qty 0.5

## 2017-08-06 MED ORDER — ONDANSETRON HCL 4 MG PO TABS: 4.0000 mg | ORAL_TABLET | ORAL | Status: DC | PRN

## 2017-08-06 MED ORDER — BISACODYL 10 MG RE SUPP
10.0000 mg | Freq: Every day | RECTAL | Status: DC | PRN
  Filled 2017-08-06: qty 1

## 2017-08-06 MED ORDER — COCONUT OIL OIL
1.0000 "application " | TOPICAL_OIL | Status: DC | PRN
  Administered 2017-08-07: 1 via TOPICAL
  Filled 2017-08-06 (×2): qty 120

## 2017-08-06 MED ORDER — BENZOCAINE-MENTHOL 20-0.5 % EX AERO
1.0000 "application " | INHALATION_SPRAY | CUTANEOUS | Status: DC | PRN
  Filled 2017-08-06: qty 56

## 2017-08-06 MED ORDER — FLEET ENEMA 7-19 GM/118ML RE ENEM: 1.0000 | ENEMA | Freq: Every day | RECTAL | Status: DC | PRN

## 2017-08-06 MED ORDER — ZOLPIDEM TARTRATE 5 MG PO TABS
5.0000 mg | ORAL_TABLET | Freq: Every evening | ORAL | Status: DC | PRN
Start: 2017-08-06 — End: 2017-08-08

## 2017-08-06 NOTE — Lactation Note (Signed)
This note was copied from a baby's chart. Lactation Consultation Note  Patient Name: Christina Kennedy MVHQI'OToday's Date: 08/06/2017 Reason for consult: Initial assessment;NICU baby;Primapara;1st time breastfeeding;Term  Mom with NICU baby. Mom just started pumping this afternoon, RN set up pump and was surprised that mom was actually able to get volume, about 1/2 ounce on each breast.  Reviewed STS in NICU, cluster feeding ("cluster pumping") pumping log and tips. Explained the importance of mother's milk to NICU babies, mom is willing to provide breastmilk. Reviewed BF basics, BF brochure and providing breastmilk for your NICU baby booklet. Mom is aware of lactation services and will call PRN.  Maternal Data Formula Feeding for Exclusion: Yes Reason for exclusion: Mother's choice to formula and breast feed on admission;Admission to Intensive Care Unit (ICU) post-partum Has patient been taught Hand Expression?: Yes Does the patient have breastfeeding experience prior to this delivery?: No    Interventions Interventions: Breast feeding basics reviewed;Breast massage;Breast compression;DEBP;Expressed milk  Lactation Tools Discussed/Used Tools: Pump Breast pump type: Double-Electric Breast Pump Initiated by:: RN Date initiated:: 08/06/17   Consult Status Consult Status: Follow-up Date: 08/07/17 Follow-up type: In-patient    [name] [name] 08/06/2017, 5:07 PM

## 2017-08-06 NOTE — Progress Notes (Signed)
Infant was transferred to NICU. Set patient up with double electric breast pump, instructed patient on use.

## 2017-08-06 NOTE — Anesthesia Postprocedure Evaluation (Signed)
Anesthesia Post Note  Patient: Christina Kennedy  Procedure(s) Performed: AN AD HOC LABOR EPIDURAL     Patient location during evaluation: Mother Baby Anesthesia Type: Epidural Level of consciousness: awake, awake and alert and oriented Pain management: pain level controlled Vital Signs Assessment: post-procedure vital signs reviewed and stable Respiratory status: spontaneous breathing, nonlabored ventilation and respiratory function stable Cardiovascular status: stable Postop Assessment: no apparent nausea or vomiting, adequate PO intake, patient able to bend at knees, no headache and no backache Anesthetic complications: no    Last Vitals:  Vitals:   08/06/17 0440 08/06/17 0540  BP: (!) 109/51 104/70  Pulse: (!) 104 (!) 105  Resp: 17 17  Temp: 37.1 C 37.1 C  SpO2: 98% 98%    Last Pain:  Vitals:   08/06/17 0540  TempSrc: Axillary  PainSc: 5    Pain Goal: Patients Stated Pain Goal: 8 (08/05/17 1503)               Tabbetha Kutscher

## 2017-08-07 ENCOUNTER — Inpatient Hospital Stay (HOSPITAL_COMMUNITY)
Admission: RE | Admit: 2017-08-07 | Discharge: 2017-08-07 | Disposition: A | Discharge status: Home / Self Care | Payer: PRIVATE HEALTH INSURANCE | Source: Ambulatory Visit | Attending: Obstetrics and Gynecology | Admitting: Obstetrics and Gynecology

## 2017-08-07 LAB — BIRTH TISSUE RECOVERY COLLECTION (PLACENTA DONATION)

## 2017-08-07 NOTE — Lactation Note (Signed)
This note was copied from a baby's chart. Lactation Consultation Note  Patient Name: Christina Kennedy UPBDH'D Date: 08/07/2017 Reason for consult: Follow-up assessment;1st time breastfeeding;Primapara;Term;NICU baby  Mom is still doing well, she just had come back from NICU. Mom was able to express 35 ml. Of colostrum this time, she was very motivated; praised her for her efforts on providing breastmilk for her baby. Spoke to mom about flange sizes, she's currently pumping with Medela 24 mm. Reminded her that she also has a 27 mm in her kit if she were need to go up a size once her milk comes in. Lactation will re-assess at that time. She stated that pumping sessions were still comfortable, but that she was getting a little sore. Recommended coconut oil for breast massage prior pumping, her nurse will be getting that for her; instructed mom how to use it. She'll call if she has any questions or concerns about BF.  Interventions Interventions: Breast feeding basics reviewed;Coconut oil;DEBP;Expressed milk  Lactation Tools Discussed/Used     Consult Status Consult Status: Follow-up Date: 08/08/17 Follow-up type: In-patient    Janila Arrazola Francene Boyers 08/07/2017, 12:03 PM

## 2017-08-07 NOTE — Progress Notes (Signed)
Post Partum Day 1 Subjective: no complaints, up ad lib, tolerating PO and no flatus yet, plans to use stool softeners today. Baby in NICU due to episodes of cyanosis and desaturations.  Objective: Blood pressure 133/66, pulse 90, temperature 98.4 F (36.9 C), temperature source Oral, resp. rate 18, height 5' (1.524 m), weight 78 kg (172 lb), last menstrual period 10/23/2016, SpO2 100 %, unknown if currently breastfeeding.  Physical Exam:  General: alert, cooperative and no distress Lochia: appropriate Uterine Fundus: firm DVT Evaluation: No evidence of DVT seen on physical exam. No cords or calf tenderness. No significant calf/ankle edema.  Recent Labs    08/05/17 1455  HGB 12.1  HCT 35.6*    Assessment/Plan: Plan for discharge tomorrow, Breastfeeding and Contraception Nexplanon   LOS: 2 days   Oralia ManisSherin Rashun Grattan, DO PGY-1 08/07/2017, 7:00 AM

## 2017-08-08 MED ORDER — IBUPROFEN 600 MG PO TABS: 600.0000 mg | ORAL_TABLET | Freq: Four times a day (QID) | ORAL | 0 refills | Status: DC

## 2017-08-08 MED ORDER — FERROUS SULFATE 325 (65 FE) MG PO TABS: 325.0000 mg | ORAL_TABLET | Freq: Two times a day (BID) | ORAL | 0 refills | Status: DC

## 2017-08-08 NOTE — Discharge Summary (Signed)
OB Discharge Summary     Patient Name: Christina DominoMelanie Deleonardis DOB: Jul 15, 1995 MRN: [id number]  Date of admission: 08/05/2017 Delivering MD: Oralia ManisABRAHAM, Jesus Nevills   Date of discharge: 08/08/2017  Admitting diagnosis: 40WKS,LABOR Intrauterine pregnancy: 2740w0d     Secondary diagnosis:  Active Problems:   Post term pregnancy  Additional problems: migraine without aura      Discharge diagnosis: Preterm Pregnancy Delivered                                                                                                Post partum procedures:none  Augmentation: AROM and Pitocin  Complications: None  Hospital course:  Induction of Labor With Vaginal Delivery   22 y.o. yo G1P1001 at 5040w0d was admitted to the hospital 08/05/2017 for induction of labor.  Indication for induction: Postdates.  Patient had an uncomplicated labor course as follows: Membrane Rupture Time/Date: 9:46 PM ,08/05/2017   Intrapartum Procedures: Episiotomy: None [1]                                         Lacerations:  None [1]  Patient had delivery of a Viable infant.  Information for the patient's newborn:  Karena AddisonRivera, Girl Cadence [[id number]][[id number]]  Delivery Method: Vaginal, Spontaneous(Filed from Delivery Summary)   08/06/2017  Details of delivery can be found in separate delivery note.  Patient had a routine postpartum course. Patient is discharged home 08/08/17.  Physical exam  Vitals:   08/06/17 1719 08/07/17 0500 08/07/17 1934 08/08/17 0603  BP: 90/70 133/66 115/64 (!) 107/56  Pulse: (!) 127 90 90 79  Resp: 18 18 18 18   Temp: 98.4 F (36.9 C) 98.4 F (36.9 C) 98.1 F (36.7 C) 97.9 F (36.6 C)  TempSrc: Axillary Oral Oral Oral  SpO2:      Weight:      Height:       General: alert, cooperative and no distress Lochia: appropriate Uterine Fundus: firm DVT Evaluation: No evidence of DVT seen on physical exam. No cords or calf tenderness. No significant calf/ankle edema. Labs: Lab Results  Component Value Date   WBC 11.3  (H) 08/05/2017   HGB 12.1 08/05/2017   HCT 35.6 (L) 08/05/2017   MCV 86.6 08/05/2017   PLT 261 08/05/2017   CMP Latest Ref Rng & Units 02/20/2017  Glucose 65 - 99 mg/dL 78  BUN 6 - 20 mg/dL 8  Creatinine 9.520.44 - 8.411.00 mg/dL 3.240.50  Sodium 401135 - 027145 mmol/L 135  Potassium 3.5 - 5.1 mmol/L 3.7  Chloride 101 - 111 mmol/L 103  CO2 22 - 32 mmol/L 23  Calcium 8.9 - 10.3 mg/dL 9.6  Total Protein 6.5 - 8.1 g/dL 7.1  Total Bilirubin 0.3 - 1.2 mg/dL 0.9  Alkaline Phos 38 - 126 U/L 58  AST 15 - 41 U/L 17  ALT 14 - 54 U/L 10(L)    Discharge instruction: per After Visit Summary and "Baby and Me Booklet".  After visit meds:  Allergies as of 08/08/2017   No  Known Allergies     Medication List    STOP taking these medications   Doxylamine-Pyridoxine 10-10 MG Tbec Commonly known as:  DICLEGIS   promethazine 25 MG tablet Commonly known as:  PHENERGAN     TAKE these medications   doxylamine (Sleep) 25 MG tablet Commonly known as:  UNISOM Take 1 tablet (25 mg total) by mouth 2 (two) times daily at 8 am and 10 pm.   ferrous sulfate 325 (65 FE) MG tablet Take 1 tablet (325 mg total) by mouth 2 (two) times daily with a meal.   ibuprofen 600 MG tablet Commonly known as:  ADVIL,MOTRIN Take 1 tablet (600 mg total) by mouth every 6 (six) hours.   pyridOXINE 50 MG tablet Commonly known as:  VITAMIN B-6 Take 1 tablet (50 mg total) by mouth 2 (two) times daily at 8 am and 10 pm.   ranitidine 150 MG tablet Commonly known as:  ZANTAC Take 1 tablet (150 mg total) by mouth 2 (two) times daily.   terconazole 0.8 % vaginal cream Commonly known as:  TERAZOL 3 Place 1 applicator vaginally at bedtime.   Vitamin D 2000 units Caps Take 1 capsule (2,000 Units total) by mouth daily.       Diet: routine diet  Activity: Advance as tolerated. Pelvic rest for 6 weeks.   Outpatient follow up:4 weeks Follow up Appt: Future Appointments  Date Time Provider Department Center  09/02/2017 10:30 AM  Brock Bad, MD [facility] None   Follow up Visit:No Follow-up on file. Follow-up Information    [facility]. Schedule an appointment as soon as possible for a visit in 4 week(s).   Specialty:  Obstetrics and Gynecology Why:  Please follow up in 4 weeks Contact information: [address], [address] [place] [address] [phone]          Postpartum contraception: Nexplanon  Newborn Data: Live born female  Birth Weight: 6 lb 12.6 oz (3080 g) APGAR: 7, 9  Newborn Delivery   Birth date/time:  08/06/2017 02:40:00 Delivery type:  Vaginal, Spontaneous     Baby Feeding: Breast Disposition:NICU   08/08/2017 Oralia Manis, DO  PGY-1

## 2017-08-08 NOTE — Discharge Instructions (Signed)

## 2017-08-08 NOTE — Lactation Note (Signed)
This note was copied from a baby's chart. Lactation Consultation Note  Patient Name: Christina Bunnie DominoMelanie Hautala UJWJX'BToday's Date: 08/08/2017   Visited with P1 Mom of term NICU baby at 5755 hrs old, on day of Mom's discharge.  Mom GDM, and baby transferred to NICU for low CBGs.  Mom has been pumping >8 times per 24 hrs, using the Medela Symphony DEBP.  Mom expressed 60 ml last pumping.   Talked about obtaining a DEBP  Mom has private health insurance and WIC.  [facility], or renting a Symphony from gift shop.  Mom will let her RN know if she would like a loaner pump from us.  Talked about importance of maintaining her milk supply with regular >8 times/24 hrs pumping.    Engorgement prevention and treatment discussed.  Mom aware of pump rooms in the NICU.  Mom instructed to take her pump parts home with her to take to NICU when with her baby.  Mom aware of OP lactation support available to her, encouraged to call prn.   Christina Kennedy, Christina Kennedy 08/08/2017, 9:48 AM

## 2017-08-08 NOTE — Lactation Note (Signed)
This note was copied from a baby's chart. Lactation Consultation Note  Patient Name: Christina Kennedy   [facility]  [facility] faxed and a [facility] note [facility] about obtaining a WIC pump while Mom with baby in the NICU.   [name], [name], 2:46 PM

## 2017-09-02 ENCOUNTER — Ambulatory Visit: Payer: PRIVATE HEALTH INSURANCE | Admitting: Obstetrics

## 2017-10-04 ENCOUNTER — Ambulatory Visit (INDEPENDENT_AMBULATORY_CARE_PROVIDER_SITE_OTHER): Payer: PRIVATE HEALTH INSURANCE | Admitting: Obstetrics

## 2017-10-04 ENCOUNTER — Encounter: Payer: Self-pay | Admitting: *Deleted

## 2017-10-04 ENCOUNTER — Encounter: Payer: Self-pay | Admitting: Obstetrics

## 2017-10-04 VITALS — BP 119/65 | HR 94

## 2017-10-04 DIAGNOSIS — Z3046 Encounter for surveillance of implantable subdermal contraceptive: Secondary | ICD-10-CM

## 2017-10-04 DIAGNOSIS — Z1389 Encounter for screening for other disorder: Secondary | ICD-10-CM

## 2017-10-04 DIAGNOSIS — Z30017 Encounter for initial prescription of implantable subdermal contraceptive: Secondary | ICD-10-CM

## 2017-10-04 DIAGNOSIS — Z3202 Encounter for pregnancy test, result negative: Secondary | ICD-10-CM

## 2017-10-04 LAB — POCT URINE PREGNANCY: Preg Test, Ur: NEGATIVE

## 2017-10-04 MED ORDER — ETONOGESTREL 68 MG ~~LOC~~ IMPL
68.0000 mg | DRUG_IMPLANT | Freq: Once | SUBCUTANEOUS | Status: AC
  Administered 2017-10-04: 68 mg via SUBCUTANEOUS

## 2017-10-04 NOTE — Progress Notes (Signed)
Nexplanon Procedure Note   PRE-OP DIAGNOSIS: desired long-term, reversible contraception ( LARC ) POST-OP DIAGNOSIS: Same  PROCEDURE: Nexplanon  placement Performing Provider: Brock BadHARLES A. Jaydalynn Olivero MD  Patient education prior to procedure, explained risk, benefits of Nexplanon, reviewed alternative options. Patient reported understanding. Gave consent to continue with procedure.   PROCEDURE:  Pregnancy Text :  Negative Site (check):      right arm         Sterile Preparation:   Betadinex3 Lot # [id number] Expiration Date 06 / 2021  Insertion site was selected 8 - 10 cm from medial epicondyle and marked along with guiding site using sterile marker. Procedure area was prepped and draped in a sterile fashion. 1% Lidocaine 1.5 ml given prior to procedure. Nexplanon  was inserted subcutaneously.Needle was removed from the insertion site. Nexplanon capsule was palpated by provider and patient to assure satisfactory placement. Dressing applied.  Followup: The patient tolerated the procedure well without complications.  Standard post-procedure care is explained and return precautions are given.  Brock BadHARLES A. Nayeli Calvert MD 10-04-2017

## 2017-10-04 NOTE — Progress Notes (Signed)
Post Partum Exam  Christina DominoMelanie Kennedy is a 22 y.o. 481P1001 female who presents for a postpartum visit. She is 8 weeks postpartum following a spontaneous vaginal delivery. I have fully reviewed the prenatal and intrapartum course. The delivery was at 41 gestational weeks.  Anesthesia: epidural. Postpartum course has been unremarkable. Baby's course has been unremarkable. Baby is feeding by bottle Rush Barer- Gerber.. Bleeding started period a week ago.. Bowel function is normal. Bladder function is normal. Patient is sexually active. Contraception method is none. Pt wants Nexplanon. Last IC, last night per pt. Postpartum depression screening:neg   The following portions of the patient's history were reviewed and updated as appropriate: allergies, current medications, past family history, past medical history, past social history, past surgical history and problem list. Last pap smear done n/a and was n/a  Review of Systems A comprehensive review of systems was negative.    Objective:  unknown if currently breastfeeding.  General:  alert and no distress   Breasts:  inspection negative, no nipple discharge or bleeding, no masses or nodularity palpable  Lungs: clear to auscultation bilaterally  Heart:  regular rate and rhythm, S1, S2 normal, no murmur, click, rub or gallop                    Extremities: No C, C, E.  Assessment:    1. Nexplanon insertion Rx: - POCT urine pregnancy  Plan:   1. Contraception: Nexplanon 2. Nexplanon Rx 3. Follow up in: 2 weeks or as needed.

## 2017-10-18 ENCOUNTER — Ambulatory Visit (INDEPENDENT_AMBULATORY_CARE_PROVIDER_SITE_OTHER): Payer: PRIVATE HEALTH INSURANCE | Admitting: Obstetrics

## 2017-10-18 ENCOUNTER — Encounter: Payer: Self-pay | Admitting: Obstetrics

## 2017-10-18 VITALS — BP 126/82 | HR 96 | Wt 156.0 lb

## 2017-10-18 DIAGNOSIS — N9412 Deep dyspareunia: Secondary | ICD-10-CM

## 2017-10-18 DIAGNOSIS — Z3046 Encounter for surveillance of implantable subdermal contraceptive: Secondary | ICD-10-CM
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
## 2017-10-18 NOTE — Progress Notes (Signed)
RGYN here to F/U Nexplanon Insertion  C/O pain w/intercourse Stabbing pain 6/10x.

## 2017-11-04 ENCOUNTER — Emergency Department (HOSPITAL_COMMUNITY)
Admission: EM | Admit: 2017-11-04 | Discharge: 2017-11-04 | Disposition: A | Discharge status: Home / Self Care | Payer: PRIVATE HEALTH INSURANCE | Attending: Emergency Medicine | Admitting: Emergency Medicine

## 2017-11-04 ENCOUNTER — Other Ambulatory Visit: Payer: Self-pay

## 2017-11-04 ENCOUNTER — Encounter (HOSPITAL_COMMUNITY): Payer: Self-pay | Admitting: Emergency Medicine

## 2017-11-04 DIAGNOSIS — M549 Dorsalgia, unspecified: Secondary | ICD-10-CM | POA: Diagnosis not present

## 2017-11-04 DIAGNOSIS — Z5321 Procedure and treatment not carried out due to patient leaving prior to being seen by health care provider: Secondary | ICD-10-CM | POA: Diagnosis not present

## 2017-11-04 NOTE — ED Notes (Signed)
Patient stated that she was leaving couldn't wait

## 2017-11-04 NOTE — ED Triage Notes (Signed)
Patient complains of lower back pain that started last night. Patient states she bent over and when she stood back up the pain was there. Denies any issues with her back prior and denies any injury.

## 2017-12-18 ENCOUNTER — Encounter (HOSPITAL_COMMUNITY): Payer: Self-pay | Admitting: Emergency Medicine

## 2017-12-18 ENCOUNTER — Emergency Department (HOSPITAL_COMMUNITY)
Admission: EM | Admit: 2017-12-18 | Discharge: 2017-12-18 | Disposition: A | Discharge status: Home / Self Care | Payer: PRIVATE HEALTH INSURANCE | Attending: Physician Assistant | Admitting: Physician Assistant

## 2017-12-18 ENCOUNTER — Other Ambulatory Visit: Payer: Self-pay

## 2017-12-18 DIAGNOSIS — Z79899 Other long term (current) drug therapy: Secondary | ICD-10-CM | POA: Diagnosis not present

## 2017-12-18 DIAGNOSIS — H9203 Otalgia, bilateral: Secondary | ICD-10-CM | POA: Diagnosis present

## 2017-12-18 DIAGNOSIS — Z87891 Personal history of nicotine dependence: Secondary | ICD-10-CM | POA: Insufficient documentation

## 2017-12-18 DIAGNOSIS — H6503 Acute serous otitis media, bilateral: Secondary | ICD-10-CM | POA: Insufficient documentation

## 2017-12-18 MED ORDER — GUAIFENESIN ER 1200 MG PO TB12: 1.0000 | ORAL_TABLET | Freq: Two times a day (BID) | ORAL | 0 refills | Status: DC

## 2017-12-18 MED ORDER — BUDESONIDE 32 MCG/ACT NA SUSP: 2.0000 | Freq: Every day | NASAL | 0 refills | Status: DC

## 2017-12-18 MED ORDER — AMOXICILLIN 500 MG PO TABS: 500.0000 mg | ORAL_TABLET | Freq: Two times a day (BID) | ORAL | 0 refills | Status: DC

## 2017-12-18 NOTE — Discharge Instructions (Addendum)
Return here as needed.  Follow-up with your primary doctor if not improving.  Increase your fluid intake.

## 2017-12-18 NOTE — ED Triage Notes (Signed)
Pt c/o left ear pain x 2 weeks. Seen by PCP 2 weeks ago when symptoms started, states she was treated for URI with no change.
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
## 2018-01-06 ENCOUNTER — Ambulatory Visit: Payer: PRIVATE HEALTH INSURANCE | Admitting: Obstetrics

## 2018-03-01 ENCOUNTER — Ambulatory Visit: Payer: Self-pay | Admitting: Emergency Medicine

## 2018-03-02 ENCOUNTER — Ambulatory Visit: Payer: PRIVATE HEALTH INSURANCE | Admitting: Obstetrics

## 2018-03-08 ENCOUNTER — Encounter: Payer: Self-pay | Admitting: *Deleted

## 2018-03-08 ENCOUNTER — Ambulatory Visit (INDEPENDENT_AMBULATORY_CARE_PROVIDER_SITE_OTHER): Payer: PRIVATE HEALTH INSURANCE | Admitting: Obstetrics and Gynecology

## 2018-03-08 ENCOUNTER — Encounter: Payer: Self-pay | Admitting: Obstetrics and Gynecology

## 2018-03-08 VITALS — BP 118/87 | HR 125 | Wt 157.8 lb

## 2018-03-08 DIAGNOSIS — Z3046 Encounter for surveillance of implantable subdermal contraceptive: Secondary | ICD-10-CM

## 2018-03-08 DIAGNOSIS — R350 Frequency of micturition: Secondary | ICD-10-CM | POA: Diagnosis not present

## 2018-03-08 DIAGNOSIS — Z309 Encounter for contraceptive management, unspecified: Secondary | ICD-10-CM | POA: Insufficient documentation

## 2018-03-08 DIAGNOSIS — Z3202 Encounter for pregnancy test, result negative: Secondary | ICD-10-CM

## 2018-03-08 DIAGNOSIS — Z30011 Encounter for initial prescription of contraceptive pills: Secondary | ICD-10-CM

## 2018-03-08 LAB — POCT URINALYSIS DIPSTICK
Bilirubin, UA: NEGATIVE
Blood, UA: POSITIVE
Glucose, UA: NEGATIVE
Ketones, UA: NEGATIVE
ODOR: POSITIVE
PH UA: 5 (ref 5.0–8.0)
PROTEIN UA: POSITIVE — AB
Spec Grav, UA: 1.02 (ref 1.010–1.025)
UROBILINOGEN UA: 0.2 U/dL

## 2018-03-08 LAB — POCT URINE PREGNANCY: Preg Test, Ur: NEGATIVE

## 2018-03-08 MED ORDER — DESOGESTREL-ETHINYL ESTRADIOL 0.15-30 MG-MCG PO TABS: 1.0000 | ORAL_TABLET | Freq: Every day | ORAL | 11 refills | Status: DC

## 2018-03-08 NOTE — Addendum Note (Signed)
Addended by: Natale Milch D on: 03/08/2018 04:12 PM   Modules accepted: Orders

## 2018-03-08 NOTE — Patient Instructions (Addendum)
Nexplanon Instructions After Removal Oral Contraception Use Oral contraceptive pills (OCPs) are medicines taken to prevent pregnancy. OCPs work by preventing the ovaries from releasing eggs. The hormones in OCPs also cause the cervical mucus to thicken, preventing the sperm from entering the uterus. The hormones also cause the uterine lining to become thin, not allowing a fertilized egg to attach to the inside of the uterus. OCPs are highly effective when taken exactly as prescribed. However, OCPs do not prevent sexually transmitted diseases (STDs). Safe sex practices, such as using condoms along with an OCP, can help prevent STDs. Before taking OCPs, you may have a physical exam and Pap test. Your health care provider may also order blood tests if necessary. Your health care provider will make sure you are a good candidate for oral contraception. Discuss with your health care provider the possible side effects of the OCP you may be prescribed. When starting an OCP, it can take 2 to 3 months for the body to adjust to the changes in hormone levels in your body. How to take oral contraceptive pills Your health care provider may advise you on how to start taking the first cycle of OCPs. Otherwise, you can:  Start on day 1 of your menstrual period. You will not need any backup contraceptive protection with this start time.  Start on the first Sunday after your menstrual period or the day you get your prescription. In these cases, you will need to use backup contraceptive protection for the first week.  Start the pill at any time of your cycle. If you take the pill within 5 days of the start of your period, you are protected against pregnancy right away. In this case, you will not need a backup form of birth control. If you start at any other time of your menstrual cycle, you will need to use another form of birth control for 7 days. If your OCP is the type called a minipill, it will protect you from pregnancy  after taking it for 2 days (48 hours).  After you have started taking OCPs:  If you forget to take 1 pill, take it as soon as you remember. Take the next pill at the regular time.  If you miss 2 or more pills, call your health care provider because different pills have different instructions for missed doses. Use backup birth control until your next menstrual period starts.  If you use a 28-day pack that contains inactive pills and you miss 1 of the last 7 pills (pills with no hormones), it will not matter. Throw away the rest of the non-hormone pills and start a new pill pack.  No matter which day you start the OCP, you will always start a new pack on that same day of the week. Have an extra pack of OCPs and a backup contraceptive method available in case you miss some pills or lose your OCP pack. Follow these instructions at home:  Do not smoke.  Always use a condom to protect against STDs. OCPs do not protect against STDs.  Use a calendar to mark your menstrual period days.  Read the information and directions that came with your OCP. Talk to your health care provider if you have questions. Contact a health care provider if:  You develop nausea and vomiting.  You have abnormal vaginal discharge or bleeding.  You develop a rash.  You miss your menstrual period.  You are losing your hair.  You need treatment for mood swings  or depression.  You get dizzy when taking the OCP.  You develop acne from taking the OCP.  You become pregnant. Get help right away if:  You develop chest pain.  You develop shortness of breath.  You have an uncontrolled or severe headache.  You develop numbness or slurred speech.  You develop visual problems.  You develop pain, redness, and swelling in the legs. This information is not intended to replace advice given to you by your health care provider. Make sure you discuss any questions you have with your health care provider. Document  Released: 06/04/2011 Document Revised: 11/21/2015 Document Reviewed: 12/04/2012 Elsevier Interactive Patient Education  2017 [employer].    Keep bandage clean and dry for 24 hours   May use ice/Tylenol/Ibuprofen for soreness or pain   If you develop fever, drainage or increased warmth from incision site-contact office immediately

## 2018-03-08 NOTE — Progress Notes (Signed)
Patient ID: Christina Kennedy, female   DOB: 22-Aug-1995, 22 y.o.   MRN: [id number]     GYNECOLOGY CLINIC PROCEDURE NOTE  Christina Kennedy is a 22 y.o. G1P1001 here for Nexplanon removal. She reports some urinary freq. She desires to switch to OCP's  Nexplanon Removal Patient identified, informed consent performed, consent signed.   Appropriate time out taken. Nexplanon site identified.  Area prepped in usual sterile fashon. One ml of 1% lidocaine was used to anesthetize the area at the distal end of the implant. A small stab incision was made right beside the implant on the distal portion.  The Nexplanon rod was grasped using hemostats and removed without difficulty.  There was minimal blood loss. There were no complications.  3 ml of 1% lidocaine was injected around the incision for post-procedure analgesia.  Steri-strips were applied over the small incision.  A pressure bandage was applied to reduce any bruising.  The patient tolerated the procedure well and was given post procedure instructions.  Patient is planning to use OCP for contraception. R/B of OCP's reviewed with pt. To start tonight F/U PRN   Nettie Elm, MD, FACOG Attending Obstetrician & Gynecologist Center for [facility], [facility]

## 2018-03-08 NOTE — Progress Notes (Signed)
Patient is in the office, wants nexplanon removed, previously inserted 10-04-17; and wants to switch to [facility] pill. Pt reports irregular bleeding and nausea, which is why she wants nexplanon removed. Pt also complains of urinary burning, frequency.

## 2018-03-10 LAB — URINE CULTURE

## 2018-03-11 ENCOUNTER — Other Ambulatory Visit: Payer: Self-pay

## 2018-03-11 DIAGNOSIS — N390 Urinary tract infection, site not specified: Secondary | ICD-10-CM

## 2018-03-11 MED ORDER — CEPHALEXIN 500 MG PO CAPS: 500.0000 mg | ORAL_CAPSULE | Freq: Four times a day (QID) | ORAL | 2 refills | Status: DC

## 2018-04-28 ENCOUNTER — Encounter: Payer: Self-pay | Admitting: Obstetrics

## 2018-04-28 ENCOUNTER — Ambulatory Visit (INDEPENDENT_AMBULATORY_CARE_PROVIDER_SITE_OTHER): Payer: PRIVATE HEALTH INSURANCE | Admitting: Obstetrics

## 2018-04-28 VITALS — BP 127/78 | HR 106 | Ht 60.0 in | Wt 163.6 lb

## 2018-04-28 DIAGNOSIS — Z3041 Encounter for surveillance of contraceptive pills: Secondary | ICD-10-CM

## 2018-04-28 DIAGNOSIS — Z30016 Encounter for initial prescription of transdermal patch hormonal contraceptive device: Secondary | ICD-10-CM

## 2018-04-28 DIAGNOSIS — R6882 Decreased libido: Secondary | ICD-10-CM

## 2018-04-28 DIAGNOSIS — N9412 Deep dyspareunia: Secondary | ICD-10-CM | POA: Diagnosis not present

## 2018-04-28 DIAGNOSIS — Z23 Encounter for immunization: Secondary | ICD-10-CM

## 2018-04-28 MED ORDER — NORELGESTROMIN-ETH ESTRADIOL 150-35 MCG/24HR TD PTWK: 1.0000 | MEDICATED_PATCH | TRANSDERMAL | 12 refills | Status: DC

## 2018-04-28 NOTE — Progress Notes (Signed)
Patient ID: Christina Kennedy, female   DOB: [birth year], 22 y.o.   MRN: [id number]  Chief Complaint  Patient presents with  . GYN    HPI Christina Kennedy is a 22 y.o. female.  Has had decreased libido since delivery of baby in Feb. 2019.  Intercourse has been painful since then also. HPI  Past Medical History:  Diagnosis Date  . Allergic rhinitis    seasonal  . Anxiety   . Chlamydia   . Headache(784.0)   . Infection    UTI    Past Surgical History:  Procedure Laterality Date  . TONSILLECTOMY AND ADENOIDECTOMY  2001  . TYMPANOSTOMY TUBE PLACEMENT  2001    Family History  Problem Relation Age of Onset  . Migraines Mother   . Migraines Maternal Grandmother   . Cancer Maternal Grandmother        throat  . Diabetes Paternal Grandfather   . Cancer Paternal Grandfather        blood cancer    Social History Social History   Tobacco Use  . Smoking status: Former Smoker    Types: E-cigarettes, Cigarettes  . Smokeless tobacco: Never Used  . Tobacco comment: June 2018  Substance Use Topics  . Alcohol use: No    Alcohol/week: 0.0 standard drinks    Comment: rare  . Drug use: No    No Known Allergies  Current Outpatient Medications  Medication Sig Dispense Refill  . cephALEXin (KEFLEX) 500 MG capsule Take 1 capsule (500 mg total) by mouth 4 (four) times daily. (Patient not taking: Reported on 04/28/2018) 28 capsule 2  . norelgestromin-ethinyl estradiol (ORTHO EVRA) 150-35 MCG/24HR transdermal patch Place 1 patch onto the skin once a week. 3 patch 12   No current facility-administered medications for this visit.     Review of Systems Review of Systems Constitutional: negative for fatigue and weight loss Respiratory: negative for cough and wheezing Cardiovascular: negative for chest pain, fatigue and palpitations Gastrointestinal: negative for abdominal pain and change in bowel habits Genitourinary:positive for decreased libido and painful  intercourse Integument/breast: negative for nipple discharge Musculoskeletal:negative for myalgias Neurological: negative for gait problems and tremors Behavioral/Psych: negative for abusive relationship, depression Endocrine: negative for temperature intolerance      Blood pressure 127/78, pulse (!) 106, height 5' (1.524 m), weight 163 lb 9.6 oz (74.2 kg), last menstrual period 03/29/2018, not currently breastfeeding.  Physical Exam Physical Exam:  Deferred  >50% of 15 min visit spent on counseling and coordination of care.   Data Reviewed Previous office notes  Assessment      1. Encounter for surveillance of contraceptive pills - wants to switch from OCP's to Patch  2. Encounter for initial prescription of transdermal patch hormonal contraceptive device Rx: - norelgestromin-ethinyl estradiol (ORTHO EVRA) 150-35 MCG/24HR transdermal patch; Place 1 patch onto the skin once a week.  Dispense: 3 patch; Refill: 12  3. Decreased libido - will follow  4. Deep dyspareunia - recommended increased foreplay and vaginal lubricants    5. Need for immunization against influenza Rx: - Flu Vaccine QUAD 36+ mos IM   Plan    Follow up in 3 months  Orders Placed This Encounter  Procedures  . Flu Vaccine QUAD 36+ mos IM   Meds ordered this encounter  Medications  . norelgestromin-ethinyl estradiol (ORTHO EVRA) 150-35 MCG/24HR transdermal patch    Sig: Place 1 patch onto the skin once a week.    Dispense:  3 patch    Refill:  12  Brock Bad MD 04-28-2018

## 2018-04-28 NOTE — Progress Notes (Signed)
Patient is in the office for GYN visit. Pt is currently on BC pills, wants to discuss low sex drive since vaginal delivery 08-06-17.

## 2018-09-01 ENCOUNTER — Ambulatory Visit: Payer: Medicaid Other | Admitting: Obstetrics

## 2019-01-10 ENCOUNTER — Ambulatory Visit (INDEPENDENT_AMBULATORY_CARE_PROVIDER_SITE_OTHER): Payer: Medicaid Other | Admitting: Obstetrics

## 2019-01-10 ENCOUNTER — Other Ambulatory Visit (HOSPITAL_COMMUNITY)
Admission: RE | Admit: 2019-01-10 | Discharge: 2019-01-10 | Disposition: A | Discharge status: Home / Self Care | Payer: PRIVATE HEALTH INSURANCE | Source: Ambulatory Visit | Attending: Obstetrics | Admitting: Obstetrics

## 2019-01-10 ENCOUNTER — Encounter: Payer: Self-pay | Admitting: Obstetrics

## 2019-01-10 ENCOUNTER — Other Ambulatory Visit: Payer: Self-pay

## 2019-01-10 VITALS — BP 124/88 | HR 120 | Temp 99.3°F | Ht 60.0 in | Wt 169.4 lb

## 2019-01-10 DIAGNOSIS — Z113 Encounter for screening for infections with a predominantly sexual mode of transmission: Secondary | ICD-10-CM

## 2019-01-10 DIAGNOSIS — N898 Other specified noninflammatory disorders of vagina: Secondary | ICD-10-CM

## 2019-01-10 DIAGNOSIS — N9411 Superficial (introital) dyspareunia: Secondary | ICD-10-CM

## 2019-01-10 DIAGNOSIS — Z0000 Encounter for general adult medical examination without abnormal findings: Secondary | ICD-10-CM

## 2019-01-10 DIAGNOSIS — Z01419 Encounter for gynecological examination (general) (routine) without abnormal findings: Secondary | ICD-10-CM | POA: Diagnosis not present

## 2019-01-10 DIAGNOSIS — Z3045 Encounter for surveillance of transdermal patch hormonal contraceptive device: Secondary | ICD-10-CM

## 2019-01-10 MED ORDER — XULANE 150-35 MCG/24HR TD PTWK: 1.0000 | MEDICATED_PATCH | TRANSDERMAL | 12 refills | Status: DC

## 2019-01-10 MED ORDER — LIDOCAINE 5 % EX OINT: 1.0000 "application " | TOPICAL_OINTMENT | CUTANEOUS | 1 refills | Status: DC | PRN

## 2019-01-10 NOTE — Progress Notes (Signed)
Pt presents for annual, pap, and all STD testing. Pt still c/o dyspareunia. Pt stopped BCP.
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
## 2019-01-11 LAB — HIV ANTIBODY (ROUTINE TESTING W REFLEX): HIV Screen 4th Generation wRfx: NONREACTIVE

## 2019-01-11 LAB — CERVICOVAGINAL ANCILLARY ONLY
Bacterial vaginitis: NEGATIVE
Candida vaginitis: POSITIVE — AB
Chlamydia: NEGATIVE
Neisseria Gonorrhea: NEGATIVE
Trichomonas: NEGATIVE

## 2019-01-11 LAB — CYTOLOGY - PAP: Diagnosis: NEGATIVE

## 2019-01-11 LAB — HEPATITIS C ANTIBODY: Hep C Virus Ab: 0.1 s/co ratio (ref 0.0–0.9)

## 2019-01-11 LAB — HEPATITIS B SURFACE ANTIGEN: Hepatitis B Surface Ag: NEGATIVE

## 2019-01-11 LAB — RPR: RPR Ser Ql: NONREACTIVE

## 2019-01-12 ENCOUNTER — Other Ambulatory Visit: Payer: Self-pay | Admitting: Obstetrics

## 2019-01-12 DIAGNOSIS — B3731 Acute candidiasis of vulva and vagina: Secondary | ICD-10-CM

## 2019-01-12 DIAGNOSIS — B373 Candidiasis of vulva and vagina: Secondary | ICD-10-CM

## 2019-01-12 MED ORDER — FLUCONAZOLE 150 MG PO TABS: 150.0000 mg | ORAL_TABLET | Freq: Once | ORAL | 0 refills | Status: AC

## 2019-02-21 ENCOUNTER — Ambulatory Visit: Payer: PRIVATE HEALTH INSURANCE | Admitting: Advanced Practice Midwife

## 2020-05-25 DIAGNOSIS — R040 Epistaxis: Secondary | ICD-10-CM | POA: Diagnosis not present

## 2020-05-25 DIAGNOSIS — R55 Syncope and collapse: Secondary | ICD-10-CM | POA: Diagnosis not present

## 2020-05-25 DIAGNOSIS — W1812XA Fall from or off toilet with subsequent striking against object, initial encounter: Secondary | ICD-10-CM | POA: Diagnosis not present

## 2020-05-25 DIAGNOSIS — R220 Localized swelling, mass and lump, head: Secondary | ICD-10-CM | POA: Diagnosis not present

## 2020-05-25 DIAGNOSIS — R1030 Lower abdominal pain, unspecified: Secondary | ICD-10-CM | POA: Diagnosis not present

## 2020-05-25 DIAGNOSIS — F17210 Nicotine dependence, cigarettes, uncomplicated: Secondary | ICD-10-CM | POA: Diagnosis not present

## 2020-05-25 DIAGNOSIS — S0992XA Unspecified injury of nose, initial encounter: Secondary | ICD-10-CM | POA: Diagnosis not present

## 2020-05-25 DIAGNOSIS — W19XXXA Unspecified fall, initial encounter: Secondary | ICD-10-CM | POA: Diagnosis not present

## 2020-05-25 DIAGNOSIS — S0033XA Contusion of nose, initial encounter: Secondary | ICD-10-CM | POA: Diagnosis not present

## 2020-07-11 ENCOUNTER — Emergency Department (INDEPENDENT_AMBULATORY_CARE_PROVIDER_SITE_OTHER)
Admission: EM | Admit: 2020-07-11 | Discharge: 2020-07-11 | Disposition: A | Discharge status: Home / Self Care | Payer: 59 | Source: Home / Self Care | Attending: Family Medicine | Admitting: Family Medicine

## 2020-07-11 ENCOUNTER — Encounter: Payer: Self-pay | Admitting: Emergency Medicine

## 2020-07-11 ENCOUNTER — Other Ambulatory Visit: Payer: Self-pay

## 2020-07-11 DIAGNOSIS — H5789 Other specified disorders of eye and adnexa: Secondary | ICD-10-CM

## 2020-07-11 MED ORDER — ERYTHROMYCIN 5 MG/GM OP OINT: TOPICAL_OINTMENT | OPHTHALMIC | 0 refills | Status: DC

## 2020-07-11 NOTE — ED Triage Notes (Addendum)
Had problems getting her contact in this am - feels like she scratched her right eye  Sclera is red  Feels like there is something in there contact removed in triage  NO COVID or Flu vaccine  Eye exam deferred per provider

## 2020-07-11 NOTE — Discharge Instructions (Signed)
Please try the ointment.  Please do this for 2-3 days.  Please do not wear contacts for the next week.  Please follow up if your symptoms fail to improve
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
## 2020-09-17 DIAGNOSIS — B373 Candidiasis of vulva and vagina: Secondary | ICD-10-CM | POA: Diagnosis not present

## 2020-09-17 DIAGNOSIS — Z124 Encounter for screening for malignant neoplasm of cervix: Secondary | ICD-10-CM | POA: Diagnosis not present

## 2020-09-17 DIAGNOSIS — Z0000 Encounter for general adult medical examination without abnormal findings: Secondary | ICD-10-CM | POA: Diagnosis not present

## 2020-09-17 DIAGNOSIS — N361 Urethral diverticulum: Secondary | ICD-10-CM | POA: Diagnosis not present

## 2020-09-17 DIAGNOSIS — R300 Dysuria: Secondary | ICD-10-CM | POA: Diagnosis not present

## 2020-09-17 DIAGNOSIS — Z30011 Encounter for initial prescription of contraceptive pills: Secondary | ICD-10-CM | POA: Diagnosis not present

## 2020-10-03 ENCOUNTER — Ambulatory Visit: Payer: 59 | Admitting: Medical-Surgical

## 2020-10-17 ENCOUNTER — Ambulatory Visit: Payer: 59 | Admitting: Family Medicine

## 2020-11-03 ENCOUNTER — Emergency Department (HOSPITAL_BASED_OUTPATIENT_CLINIC_OR_DEPARTMENT_OTHER)
Admission: EM | Admit: 2020-11-03 | Discharge: 2020-11-03 | Disposition: A | Discharge status: Home / Self Care | Payer: 59 | Attending: Emergency Medicine | Admitting: Emergency Medicine

## 2020-11-03 ENCOUNTER — Emergency Department (HOSPITAL_BASED_OUTPATIENT_CLINIC_OR_DEPARTMENT_OTHER): Payer: 59

## 2020-11-03 ENCOUNTER — Encounter (HOSPITAL_BASED_OUTPATIENT_CLINIC_OR_DEPARTMENT_OTHER): Payer: Self-pay | Admitting: *Deleted

## 2020-11-03 ENCOUNTER — Other Ambulatory Visit: Payer: Self-pay

## 2020-11-03 DIAGNOSIS — N83201 Unspecified ovarian cyst, right side: Secondary | ICD-10-CM | POA: Diagnosis not present

## 2020-11-03 DIAGNOSIS — R1030 Lower abdominal pain, unspecified: Secondary | ICD-10-CM | POA: Insufficient documentation

## 2020-11-03 DIAGNOSIS — N838 Other noninflammatory disorders of ovary, fallopian tube and broad ligament: Secondary | ICD-10-CM | POA: Diagnosis not present

## 2020-11-03 DIAGNOSIS — R1031 Right lower quadrant pain: Secondary | ICD-10-CM | POA: Diagnosis not present

## 2020-11-03 DIAGNOSIS — F17210 Nicotine dependence, cigarettes, uncomplicated: Secondary | ICD-10-CM | POA: Insufficient documentation

## 2020-11-03 DIAGNOSIS — R109 Unspecified abdominal pain: Secondary | ICD-10-CM | POA: Diagnosis not present

## 2020-11-03 DIAGNOSIS — R102 Pelvic and perineal pain: Secondary | ICD-10-CM

## 2020-11-03 LAB — CBC WITH DIFFERENTIAL/PLATELET
Abs Immature Granulocytes: 0.01 10*3/uL (ref 0.00–0.07)
Basophils Absolute: 0 10*3/uL (ref 0.0–0.1)
Basophils Relative: 0 %
Eosinophils Absolute: 0.3 10*3/uL (ref 0.0–0.5)
Eosinophils Relative: 6 %
HCT: 39 % (ref 36.0–46.0)
Hemoglobin: 13.2 g/dL (ref 12.0–15.0)
Immature Granulocytes: 0 %
Lymphocytes Relative: 22 %
Lymphs Abs: 1.3 10*3/uL (ref 0.7–4.0)
MCH: 30.1 pg (ref 26.0–34.0)
MCHC: 33.8 g/dL (ref 30.0–36.0)
MCV: 88.8 fL (ref 80.0–100.0)
Monocytes Absolute: 0.4 10*3/uL (ref 0.1–1.0)
Monocytes Relative: 7 %
Neutro Abs: 3.8 10*3/uL (ref 1.7–7.7)
Neutrophils Relative %: 65 %
Platelets: 219 10*3/uL (ref 150–400)
RBC: 4.39 MIL/uL (ref 3.87–5.11)
RDW: 13.2 % (ref 11.5–15.5)
WBC: 5.9 10*3/uL (ref 4.0–10.5)
nRBC: 0 % (ref 0.0–0.2)

## 2020-11-03 LAB — URINALYSIS, ROUTINE W REFLEX MICROSCOPIC
Bilirubin Urine: NEGATIVE
Glucose, UA: NEGATIVE mg/dL
Hgb urine dipstick: NEGATIVE
Ketones, ur: NEGATIVE mg/dL
Nitrite: NEGATIVE
Protein, ur: NEGATIVE mg/dL
Specific Gravity, Urine: <1.005 — ABNORMAL LOW (ref 1.005–1.030)
pH: 6 (ref 5.0–8.0)

## 2020-11-03 LAB — WET PREP, GENITAL
Clue Cells Wet Prep HPF POC: NONE SEEN
Sperm: NONE SEEN
Trich, Wet Prep: NONE SEEN
Yeast Wet Prep HPF POC: NONE SEEN

## 2020-11-03 LAB — COMPREHENSIVE METABOLIC PANEL
ALT: 11 U/L (ref 0–44)
AST: 15 U/L (ref 15–41)
Albumin: 4.2 g/dL (ref 3.5–5.0)
Alkaline Phosphatase: 62 U/L (ref 38–126)
Anion gap: 9 (ref 5–15)
BUN: 8 mg/dL (ref 6–20)
CO2: 22 mmol/L (ref 22–32)
Calcium: 9.4 mg/dL (ref 8.9–10.3)
Chloride: 106 mmol/L (ref 98–111)
Creatinine, Ser: 0.66 mg/dL (ref 0.44–1.00)
GFR, Estimated: >60 mL/min (ref >60)
Glucose, Bld: 87 mg/dL (ref 70–99)
Potassium: 3.6 mmol/L (ref 3.5–5.1)
Sodium: 137 mmol/L (ref 135–145)
Total Bilirubin: 0.6 mg/dL (ref 0.3–1.2)
Total Protein: 7.2 g/dL (ref 6.5–8.1)

## 2020-11-03 LAB — LIPASE, BLOOD: Lipase: 35 U/L (ref 11–51)

## 2020-11-03 LAB — URINALYSIS, MICROSCOPIC (REFLEX)

## 2020-11-03 LAB — PREGNANCY, URINE: Preg Test, Ur: NEGATIVE

## 2020-11-03 MED ORDER — ONDANSETRON HCL 4 MG/2ML IJ SOLN
4.0000 mg | Freq: Once | INTRAMUSCULAR | Status: AC
  Administered 2020-11-03: 4 mg via INTRAVENOUS
  Filled 2020-11-03: qty 2

## 2020-11-03 MED ORDER — SODIUM CHLORIDE 0.9 % IV SOLN
1.0000 g | Freq: Once | INTRAVENOUS | Status: AC
  Administered 2020-11-03: 1 g via INTRAVENOUS
  Filled 2020-11-03: qty 10

## 2020-11-03 MED ORDER — KETOROLAC TROMETHAMINE 30 MG/ML IJ SOLN
30.0000 mg | Freq: Once | INTRAMUSCULAR | Status: AC
  Administered 2020-11-03: 30 mg via INTRAVENOUS
  Filled 2020-11-03: qty 1

## 2020-11-03 MED ORDER — FENTANYL CITRATE (PF) 100 MCG/2ML IJ SOLN
50.0000 ug | Freq: Once | INTRAMUSCULAR | Status: AC
Start: 2020-11-03 — End: 2020-11-03
  Administered 2020-11-03: 50 ug via INTRAVENOUS
  Filled 2020-11-03: qty 2

## 2020-11-03 MED ORDER — DOXYCYCLINE HYCLATE 100 MG PO CAPS: 100.0000 mg | ORAL_CAPSULE | Freq: Two times a day (BID) | ORAL | 0 refills | Status: DC

## 2020-11-03 MED ORDER — DOXYCYCLINE HYCLATE 100 MG PO TABS
100.0000 mg | ORAL_TABLET | Freq: Once | ORAL | Status: AC
  Administered 2020-11-03: 100 mg via ORAL
  Filled 2020-11-03: qty 1

## 2020-11-03 MED ORDER — SODIUM CHLORIDE 0.9 % IV BOLUS
1000.0000 mL | Freq: Once | INTRAVENOUS | Status: AC
  Administered 2020-11-03: 1000 mL via INTRAVENOUS

## 2020-11-03 MED ORDER — IOHEXOL 300 MG/ML  SOLN
100.0000 mL | Freq: Once | INTRAMUSCULAR | Status: AC | PRN
  Administered 2020-11-03: 100 mL via INTRAVENOUS

## 2020-11-03 NOTE — ED Triage Notes (Signed)
Pt reports dx with ?cyst in her vagina 2 years ago which causes pain with intercourse. States she was told she might have to have surgery for it. Over the last 2 days pain has increased in lower abdomen and she has pain and difficulty urinating. States she doesn't feel like she is emptying her bladder

## 2020-11-03 NOTE — ED Notes (Signed)
ED Provider at bedside. 
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
## 2020-11-03 NOTE — ED Notes (Signed)
After receiving permission from Deja, patient was removed from IV pump to go to U/S for scan and then placed back on pump after return from U/S.  Patient was informed to keep arm as straight as possible when placed back on pump.

## 2020-11-03 NOTE — Discharge Instructions (Addendum)
As we discussed, your imaging showed a right adnexal cyst.  This likely could be contributing to your pain.  No other acute abnormalities.  As we discussed, we have a wound culture pending on the area in the vagina.  Take antibiotics as directed. Please take all of your antibiotics until finished.  It is very important that you follow-up with your OB/GYN.  Please contact their office tomorrow and tell them that you are here in the emergency department that they can follow-up with you.  If it anytime, you have worsening abdominal pain, difficulty breathing, vomiting or high fever, return the emergency department.

## 2020-11-04 LAB — GC/CHLAMYDIA PROBE AMP (~~LOC~~) NOT AT ARMC
Chlamydia: NEGATIVE
Comment: NEGATIVE
Comment: NORMAL
Neisseria Gonorrhea: NEGATIVE

## 2020-11-06 DIAGNOSIS — N83201 Unspecified ovarian cyst, right side: Secondary | ICD-10-CM | POA: Diagnosis not present

## 2020-11-06 DIAGNOSIS — R300 Dysuria: Secondary | ICD-10-CM | POA: Diagnosis not present

## 2020-11-06 DIAGNOSIS — N361 Urethral diverticulum: Secondary | ICD-10-CM | POA: Diagnosis not present

## 2020-11-06 LAB — AEROBIC CULTURE W GRAM STAIN (SUPERFICIAL SPECIMEN): Gram Stain: NONE SEEN

## 2020-11-07 ENCOUNTER — Telehealth: Payer: Self-pay | Admitting: *Deleted

## 2020-11-07 NOTE — Telephone Encounter (Signed)
Post ED Visit - Positive Culture Follow-up  Culture report reviewed by antimicrobial stewardship pharmacist: [facility] []  , Pharm.D. []  Enzo Bi, Pharm.D., BCPS AQ-ID []  , Pharm.D., BCPS []  Celedonio Miyamoto, Pharm.D., BCPS []  [place], Garvin Fila.D., BCPS, AAHIVP []  , Pharm.D., BCPS, AAHIVP []  Georgina Pillion, PharmD, BCPS []  , PharmD, BCPS []  [place], PharmD, BCPS []  [address], PharmD []  , PharmD, BCPS []  Estella Husk, PharmD  Pharmacy Team []  Lysle Pearl, PharmD []  , PharmD []  Phillips Climes, PharmD []  , Rph []  Agapito Games) , PharmD []  Verlan Friends, PharmD []  , PharmD []  Mervyn Gay, PharmD []  , PharmD []  Vinnie Level, PharmD []  [facility], PharmD []  , PharmD []  Len Childs, PharmD   Positive urine culture Treated with Doxycycline Hyclate, organism sensitive to the same and no further patient follow-up is required at this time.  , PharmD  Greer Pickerel [facility] 11/07/2020, 11:09 AM

## 2020-11-29 DIAGNOSIS — N361 Urethral diverticulum: Secondary | ICD-10-CM | POA: Diagnosis not present

## 2020-12-06 DIAGNOSIS — N361 Urethral diverticulum: Secondary | ICD-10-CM | POA: Diagnosis not present

## 2020-12-10 DIAGNOSIS — N361 Urethral diverticulum: Secondary | ICD-10-CM | POA: Diagnosis not present

## 2021-03-04 ENCOUNTER — Emergency Department (HOSPITAL_BASED_OUTPATIENT_CLINIC_OR_DEPARTMENT_OTHER)
Admission: EM | Admit: 2021-03-04 | Discharge: 2021-03-04 | Disposition: A | Discharge status: Home / Self Care | Payer: Medicaid Other | Attending: Emergency Medicine | Admitting: Emergency Medicine

## 2021-03-04 ENCOUNTER — Emergency Department (HOSPITAL_BASED_OUTPATIENT_CLINIC_OR_DEPARTMENT_OTHER): Payer: Medicaid Other

## 2021-03-04 ENCOUNTER — Encounter (HOSPITAL_BASED_OUTPATIENT_CLINIC_OR_DEPARTMENT_OTHER): Payer: Self-pay

## 2021-03-04 ENCOUNTER — Other Ambulatory Visit (HOSPITAL_BASED_OUTPATIENT_CLINIC_OR_DEPARTMENT_OTHER): Payer: Self-pay

## 2021-03-04 ENCOUNTER — Other Ambulatory Visit: Payer: Self-pay

## 2021-03-04 DIAGNOSIS — R1032 Left lower quadrant pain: Secondary | ICD-10-CM | POA: Diagnosis present

## 2021-03-04 DIAGNOSIS — F17210 Nicotine dependence, cigarettes, uncomplicated: Secondary | ICD-10-CM | POA: Insufficient documentation

## 2021-03-04 DIAGNOSIS — R110 Nausea: Secondary | ICD-10-CM | POA: Insufficient documentation

## 2021-03-04 DIAGNOSIS — R197 Diarrhea, unspecified: Secondary | ICD-10-CM | POA: Insufficient documentation

## 2021-03-04 DIAGNOSIS — R52 Pain, unspecified: Secondary | ICD-10-CM

## 2021-03-04 DIAGNOSIS — N83201 Unspecified ovarian cyst, right side: Secondary | ICD-10-CM

## 2021-03-04 DIAGNOSIS — N83291 Other ovarian cyst, right side: Secondary | ICD-10-CM | POA: Diagnosis not present

## 2021-03-04 LAB — URINALYSIS, ROUTINE W REFLEX MICROSCOPIC
Bilirubin Urine: NEGATIVE
Glucose, UA: NEGATIVE mg/dL
Ketones, ur: NEGATIVE mg/dL
Leukocytes,Ua: NEGATIVE
Nitrite: NEGATIVE
Protein, ur: NEGATIVE mg/dL
Specific Gravity, Urine: 1.02 (ref 1.005–1.030)
pH: 6 (ref 5.0–8.0)

## 2021-03-04 LAB — WET PREP, GENITAL
Clue Cells Wet Prep HPF POC: NONE SEEN
Sperm: NONE SEEN
Trich, Wet Prep: NONE SEEN
Yeast Wet Prep HPF POC: NONE SEEN

## 2021-03-04 LAB — CBC WITH DIFFERENTIAL/PLATELET
Abs Immature Granulocytes: 0.02 10*3/uL (ref 0.00–0.07)
Basophils Absolute: 0 10*3/uL (ref 0.0–0.1)
Basophils Relative: 1 %
Eosinophils Absolute: 0.6 10*3/uL — ABNORMAL HIGH (ref 0.0–0.5)
Eosinophils Relative: 8 %
HCT: 41.1 % (ref 36.0–46.0)
Hemoglobin: 13.9 g/dL (ref 12.0–15.0)
Immature Granulocytes: 0 %
Lymphocytes Relative: 16 %
Lymphs Abs: 1.3 10*3/uL (ref 0.7–4.0)
MCH: 29.6 pg (ref 26.0–34.0)
MCHC: 33.8 g/dL (ref 30.0–36.0)
MCV: 87.6 fL (ref 80.0–100.0)
Monocytes Absolute: 0.5 10*3/uL (ref 0.1–1.0)
Monocytes Relative: 6 %
Neutro Abs: 5.7 10*3/uL (ref 1.7–7.7)
Neutrophils Relative %: 69 %
Platelets: 237 10*3/uL (ref 150–400)
RBC: 4.69 MIL/uL (ref 3.87–5.11)
RDW: 13.2 % (ref 11.5–15.5)
WBC: 8.2 10*3/uL (ref 4.0–10.5)
nRBC: 0 % (ref 0.0–0.2)

## 2021-03-04 LAB — COMPREHENSIVE METABOLIC PANEL
ALT: 13 U/L (ref 0–44)
AST: 16 U/L (ref 15–41)
Albumin: 4.7 g/dL (ref 3.5–5.0)
Alkaline Phosphatase: 71 U/L (ref 38–126)
Anion gap: 7 (ref 5–15)
BUN: 11 mg/dL (ref 6–20)
CO2: 24 mmol/L (ref 22–32)
Calcium: 9.5 mg/dL (ref 8.9–10.3)
Chloride: 104 mmol/L (ref 98–111)
Creatinine, Ser: 0.65 mg/dL (ref 0.44–1.00)
GFR, Estimated: >60 mL/min (ref >60)
Glucose, Bld: 92 mg/dL (ref 70–99)
Potassium: 3.6 mmol/L (ref 3.5–5.1)
Sodium: 135 mmol/L (ref 135–145)
Total Bilirubin: 0.3 mg/dL (ref 0.3–1.2)
Total Protein: 8.3 g/dL — ABNORMAL HIGH (ref 6.5–8.1)

## 2021-03-04 LAB — URINALYSIS, MICROSCOPIC (REFLEX)

## 2021-03-04 LAB — LIPASE, BLOOD: Lipase: 31 U/L (ref 11–51)

## 2021-03-04 LAB — PREGNANCY, URINE: Preg Test, Ur: NEGATIVE

## 2021-03-04 MED ORDER — ONDANSETRON HCL 4 MG/2ML IJ SOLN
4.0000 mg | Freq: Once | INTRAMUSCULAR | Status: AC
  Administered 2021-03-04: 4 mg via INTRAVENOUS
  Filled 2021-03-04: qty 2

## 2021-03-04 MED ORDER — IOHEXOL 350 MG/ML SOLN
100.0000 mL | Freq: Once | INTRAVENOUS | Status: AC | PRN
  Administered 2021-03-04: 85 mL via INTRAVENOUS

## 2021-03-04 MED ORDER — SODIUM CHLORIDE 0.9 % IV BOLUS
1000.0000 mL | Freq: Once | INTRAVENOUS | Status: AC
  Administered 2021-03-04: 1000 mL via INTRAVENOUS

## 2021-03-04 MED ORDER — FENTANYL CITRATE PF 50 MCG/ML IJ SOSY
50.0000 ug | PREFILLED_SYRINGE | Freq: Once | INTRAMUSCULAR | Status: AC
  Administered 2021-03-04: 50 ug via INTRAVENOUS
  Filled 2021-03-04: qty 1

## 2021-03-04 MED ORDER — OXYCODONE HCL 5 MG PO TABS
5.0000 mg | ORAL_TABLET | Freq: Three times a day (TID) | ORAL | 0 refills | Status: DC | PRN
  Filled 2021-03-04: qty 10, 4d supply, fill #0

## 2021-03-04 NOTE — ED Notes (Signed)
ED Provider at bedside. 

## 2021-03-04 NOTE — ED Notes (Signed)
Pt ambulated to US

## 2021-03-04 NOTE — ED Triage Notes (Signed)
Pt arrives to ED with reports of pain for the last week, reports some nausea, diarrhea, and low fever. Pt states pain has gone from left lower abdomen into back. History of ureteral diverticulum. Pt unsure of any urinary symptoms.
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
## 2021-03-04 NOTE — Discharge Instructions (Addendum)
Follow up with OB/GYN FOR THESE FOLLOWING FINIDINGS  1. Two adjacent right adnexal cysts measuring up to 2.7 cm. Within the larger cyst is a 1.1 cm nonvascular area of nodularity. While this could reflect a retractile clot within a hemorrhagic cyst, a solid neoplasm is not excluded. Follow-up nonemergent MRI of the pelvis with contrast is recommended. This recommendation follows the consensus statement: Management of Asymptomatic Ovarian and Other Adnexal Cysts Imaged at [place]: Society of Radiologists in Ultrasound Consensus Conference Statement. Radiology 2010; [phone]. 2. No evidence of adnexal torsion. 3. Small volume free fluid within the pelvis, likely physiologic. 4. Unremarkable appearance of the uterus and left ovary.

## 2021-03-05 LAB — GC/CHLAMYDIA PROBE AMP (~~LOC~~) NOT AT ARMC
Chlamydia: NEGATIVE
Comment: NEGATIVE
Comment: NORMAL
Neisseria Gonorrhea: NEGATIVE

## 2021-04-03 ENCOUNTER — Encounter (HOSPITAL_COMMUNITY): Payer: Self-pay | Admitting: Radiology

## 2021-07-03 ENCOUNTER — Emergency Department (HOSPITAL_BASED_OUTPATIENT_CLINIC_OR_DEPARTMENT_OTHER): Payer: PRIVATE HEALTH INSURANCE

## 2021-07-03 ENCOUNTER — Other Ambulatory Visit: Payer: Self-pay

## 2021-07-03 ENCOUNTER — Encounter (HOSPITAL_BASED_OUTPATIENT_CLINIC_OR_DEPARTMENT_OTHER): Payer: Self-pay

## 2021-07-03 ENCOUNTER — Emergency Department (HOSPITAL_BASED_OUTPATIENT_CLINIC_OR_DEPARTMENT_OTHER)
Admission: EM | Admit: 2021-07-03 | Discharge: 2021-07-03 | Disposition: A | Discharge status: Home / Self Care | Payer: PRIVATE HEALTH INSURANCE | Attending: Emergency Medicine | Admitting: Emergency Medicine

## 2021-07-03 DIAGNOSIS — N760 Acute vaginitis: Secondary | ICD-10-CM | POA: Diagnosis not present

## 2021-07-03 DIAGNOSIS — R102 Pelvic and perineal pain: Secondary | ICD-10-CM

## 2021-07-03 DIAGNOSIS — R1032 Left lower quadrant pain: Secondary | ICD-10-CM

## 2021-07-03 DIAGNOSIS — B9689 Other specified bacterial agents as the cause of diseases classified elsewhere: Secondary | ICD-10-CM

## 2021-07-03 DIAGNOSIS — N340 Urethral abscess: Secondary | ICD-10-CM | POA: Insufficient documentation

## 2021-07-03 HISTORY — DX: Urethral diverticulum: N36.1

## 2021-07-03 LAB — URINALYSIS, ROUTINE W REFLEX MICROSCOPIC
Bilirubin Urine: NEGATIVE
Glucose, UA: NEGATIVE mg/dL
Hgb urine dipstick: NEGATIVE
Ketones, ur: NEGATIVE mg/dL
Nitrite: NEGATIVE
Protein, ur: NEGATIVE mg/dL
Specific Gravity, Urine: 1.01 (ref 1.005–1.030)
pH: 6.5 (ref 5.0–8.0)

## 2021-07-03 LAB — URINALYSIS, MICROSCOPIC (REFLEX)

## 2021-07-03 LAB — WET PREP, GENITAL
Sperm: NONE SEEN
Trich, Wet Prep: NONE SEEN
WBC, Wet Prep HPF POC: <10 (ref <10)
Yeast Wet Prep HPF POC: NONE SEEN

## 2021-07-03 LAB — PREGNANCY, URINE: Preg Test, Ur: NEGATIVE

## 2021-07-03 MED ORDER — SULFAMETHOXAZOLE-TRIMETHOPRIM 800-160 MG PO TABS: 1.0000 | ORAL_TABLET | Freq: Two times a day (BID) | ORAL | 0 refills | Status: AC

## 2021-07-03 MED ORDER — ONDANSETRON HCL 4 MG PO TABS: 4.0000 mg | ORAL_TABLET | Freq: Three times a day (TID) | ORAL | 0 refills | Status: DC | PRN

## 2021-07-03 MED ORDER — HYDROCODONE-ACETAMINOPHEN 5-325 MG PO TABS: 2.0000 | ORAL_TABLET | ORAL | 0 refills | Status: DC | PRN

## 2021-07-03 MED ORDER — METRONIDAZOLE 500 MG PO TABS: 500.0000 mg | ORAL_TABLET | Freq: Two times a day (BID) | ORAL | 0 refills | Status: DC

## 2021-07-03 NOTE — ED Notes (Signed)
US completed

## 2021-07-03 NOTE — ED Provider Notes (Signed)
[place] EMERGENCY DEPARTMENT Provider Note   CSN: [id number] Arrival date & time: 07/03/21  [id number]     History  Chief Complaint  Patient presents with   Abdominal Pain    Fatimatou Keeffe is a 26 y.o. female.  The history is provided by the patient and medical records. No language interpreter was used.  Abdominal Pain Pain location:  LLQ Pain quality: cramping   Pain quality: not aching   Pain radiates to:  Groin Pain severity:  Moderate Onset quality:  Gradual Duration:  1 day Timing:  Constant Progression:  Improving Chronicity:  Recurrent Relieved by:  Nothing Worsened by:  Nothing Ineffective treatments:  None tried Associated symptoms: no belching, no chest pain, no chills, no constipation, no cough, no diarrhea, no dysuria, no fatigue, no fever, no flatus, no melena, no nausea, no shortness of breath, no vaginal bleeding, no vaginal discharge and no vomiting       Home Medications Prior to Admission medications   Medication Sig Start Date End Date Taking? Authorizing Provider  cephALEXin (KEFLEX) 500 MG capsule Take 1 capsule (500 mg total) by mouth 4 (four) times daily. Patient not taking: Reported on 04/28/2018 03/11/18   Chancy Milroy, MD  doxycycline (VIBRAMYCIN) 100 MG capsule Take 1 capsule (100 mg total) by mouth 2 (two) times daily. 11/03/20   Volanda Napoleon, PA-C  erythromycin ophthalmic ointment Place a 1/2 inch ribbon of ointment into the lower eyelid. 07/11/20   Rosemarie Ax, MD  lidocaine (XYLOCAINE) 5 % ointment Apply 1 application topically as needed. 01/10/19   Shelly Bombard, MD  norelgestromin-ethinyl estradiol (ORTHO EVRA) 150-35 MCG/24HR transdermal patch Place 1 patch onto the skin once a week. Patient not taking: Reported on 01/10/2019 04/28/18   Shelly Bombard, MD  norelgestromin-ethinyl estradiol Marilu Favre) 150-35 MCG/24HR transdermal patch Place 1 patch onto the skin once a week. Patient not taking: Reported on 07/11/2020  01/10/19   Shelly Bombard, MD  oxyCODONE (ROXICODONE) 5 MG immediate release tablet Take 1 tablet (5 mg total) by mouth every 8 (eight) hours as needed for up to 10 doses for severe pain. 03/04/21   Lennice Sites, DO      Allergies    Patient has no known allergies.    Review of Systems   Review of Systems  Constitutional:  Negative for chills, diaphoresis, fatigue and fever.  HENT:  Negative for congestion.   Respiratory:  Negative for cough, chest tightness and shortness of breath.   Cardiovascular:  Negative for chest pain.  Gastrointestinal:  Positive for abdominal pain. Negative for constipation, diarrhea, flatus, melena, nausea and vomiting.  Genitourinary:  Negative for difficulty urinating, dysuria, flank pain, frequency, genital sores, pelvic pain, vaginal bleeding and vaginal discharge.  Musculoskeletal:  Negative for back pain, neck pain and neck stiffness.  Skin:  Negative for wound.  Neurological:  Negative for light-headedness and headaches.  Psychiatric/Behavioral:  Negative for agitation.   All other systems reviewed and are negative.  Physical Exam Updated Vital Signs BP 117/83 (BP Location: Right Arm)    Pulse 95    Temp 98.3 F (36.8 C) (Oral)    Resp 18    Ht 5' (1.524 m)    Wt 78.5 kg    LMP 06/03/2021    SpO2 100%    BMI 33.79 kg/m  Physical Exam Vitals and nursing note reviewed. Exam conducted with a chaperone present.  Constitutional:      General: She is not in  acute distress.    Appearance: She is well-developed. She is not ill-appearing, toxic-appearing or diaphoretic.  HENT:     Head: Normocephalic and atraumatic.  Eyes:     Conjunctiva/sclera: Conjunctivae normal.  Cardiovascular:     Rate and Rhythm: Normal rate and regular rhythm.     Heart sounds: No murmur heard. Pulmonary:     Effort: Pulmonary effort is normal. No respiratory distress.     Breath sounds: Normal breath sounds.  Abdominal:     General: Abdomen is flat. Bowel sounds are  normal.     Palpations: Abdomen is soft.     Tenderness: There is abdominal tenderness in the left lower quadrant. There is no right CVA tenderness, left CVA tenderness, guarding or rebound.  Genitourinary:    Labia:        Right: No tenderness.        Left: No tenderness.      Vagina: Vaginal discharge (from cyst) present. No bleeding.     Cervix: No cervical motion tenderness, discharge or cervical bleeding.     Adnexa:        Right: No tenderness.         Left: No tenderness.      Musculoskeletal:        General: No swelling.     Cervical back: Neck supple.  Skin:    General: Skin is warm and dry.     Capillary Refill: Capillary refill takes less than 2 seconds.     Findings: No rash.  Neurological:     Mental Status: She is alert.  Psychiatric:        Mood and Affect: Mood normal.    ED Results / Procedures / Treatments   Labs (all labs ordered are listed, but only abnormal results are displayed) Labs Reviewed  WET PREP, GENITAL - Abnormal; Notable for the following components:      Result Value   Clue Cells Wet Prep HPF POC PRESENT (*)    All other components within normal limits  URINALYSIS, ROUTINE W REFLEX MICROSCOPIC - Abnormal; Notable for the following components:   Leukocytes,Ua SMALL (*)    All other components within normal limits  URINALYSIS, MICROSCOPIC (REFLEX) - Abnormal; Notable for the following components:   Bacteria, UA MANY (*)    All other components within normal limits  URINE CULTURE  AEROBIC CULTURE W GRAM STAIN (SUPERFICIAL SPECIMEN)  PREGNANCY, URINE  GC/CHLAMYDIA PROBE AMP ([place]) NOT AT [facility]    EKG None  Radiology US PELVIC COMPLETE W TRANSVAGINAL AND TORSION R/O  Result Date: 07/03/2021 CLINICAL DATA:  Pelvic pain EXAM: TRANSABDOMINAL AND TRANSVAGINAL ULTRASOUND OF PELVIS TECHNIQUE: Both transabdominal and transvaginal ultrasound examinations of the pelvis were performed. Transabdominal technique was performed for global  imaging of the pelvis including uterus, ovaries, adnexal regions, and pelvic cul-de-sac. It was necessary to proceed with endovaginal exam following the transabdominal exam to visualize the endometrium. COMPARISON:  Pelvic sonogram dated March 04, 2021 FINDINGS: Uterus Measurements: 8.2 x 2.9 x 3.9 = volume: 48.6 mL. No fibroids or other mass visualized. Endometrium Thickness: 2 mm.  No focal abnormality visualized. Right ovary Measurements: 3.0 x 2.7 x 2.2 = volume: 9.3 mL. Normal appearance/no adnexal mass. No appreciable nodularity or complex cysts seen. Left ovary Measurements: 2.4 x 2.8 x 2.1 = volume: 7.4 mL. Normal appearance/no adnexal mass. Other findings No abnormal free fluid. IMPRESSION: Normal examination. Electronically Signed   By: Keane Police D.O.   On: 07/03/2021 11:28  Procedures Procedures    Medications Ordered in ED Medications - No data to display  ED Course/ Medical Decision Making/ A&P                           Medical Decision Making  Jeneal Hickmott is a 26 y.o. female with a past medical history significant for chronic abdominal pain, ovarian cysts, previous STI, urethral diverticulum, and headaches who presents with left abdominal pain.  According to patient, she has been having left pelvic/lower abdominal pain since last night but she chronically has had the pain for several months.  She says that she has had work-up showing that it was likely ovarian cysts causing trouble but she has not yet been able to see an OB/GYN.  She reports that at 2 AM it rapidly worsened and pain is now an 8 out of 10 in severity.  She denies any vaginal discharge or vaginal bleeding.  Denies any trauma.  Denies any fevers, chills, nausea, vomiting, constipation, diarrhea, or urinary changes.  She says that she daily has the pain but worsened overnight.  She  On exam, lungs clear and chest nontender.  Abdomen was nontender in the upper abdomen but had some lower abdominal/pelvic  tenderness.  Flanks and back nontender.  Patient otherwise well-appearing.  We will do a pelvic exam with chaperone and get some swabs.  Due to the history of ovarian cyst, and the sudden onset of worsened pain, we had discussion and feel it is important for her to get a pelvic ultrasound to rule out torsion or other etiology.  Given the similarity to previous discomfort that was felt to be cysts, patient agrees to hold on labs and CT scan at this time but if concerning findings are discovered ultrasound we may need to broaden her work-up and patient agrees.  Patient would like to hold on pain medicine so that she can drive home and if cysts are discovered, anticipate discharge with prescription for pain medicine and OB/GYN follow-up.  Pelvic exam did not reveal any tenderness in her adnexa or cervix.  When we inserted the speculum on exam, she did have purulence pour out of what appeared to be a Skene's gland duct on the left side of her vagina.  There was no external tenderness but purulence continued to come out during the exam.  There did not appear to be an abscess easy to drain but I suspect patient has an infected gland.  She does have that she has a known urethral diverticulum that can get infected over there as well.  Patient has been on antibiotics in the past for this and thinks this is similar.  Work-up returned and ultrasound was reassuring.  She did not have any external palpated the mass that would be amenable to aspiration for what I suspect is a Skene's gland or urethral diverticulum abscess as it was actively draining.  Her pain improved after the pelvic exam which caused some of the purulence to pour out.  I was able to collect a wound culture of the purulence coming out of the duct.   Her labs did confirm bacterial vaginosis and her urine did not show any nitrites.  She has no urinary symptoms we agreed to hold on other antibiotics but will get the culture.  Patient will be given  prescription for Flagyl for the BV and Bactrim for the gland infection/diverticular infection.  Patient knows to follow-up with her OB/GYN team and  her urology team and PCP.  Also give pain and nausea medicine for her symptom control.  Patient agrees with plan of care and had no questions or concerns.  Patient will follow up on the results of the swabs and cultures and understands return precautions.  Patient discharged in good condition.            Final Clinical Impression(s) / ED Diagnoses Final diagnoses:  BV (bacterial vaginosis)  Skene's gland abscess  Left lower quadrant abdominal pain    Rx / DC Orders ED Discharge Orders          Ordered    sulfamethoxazole-trimethoprim (BACTRIM DS) 800-160 MG tablet  2 times daily        07/03/21 1242    metroNIDAZOLE (FLAGYL) 500 MG tablet  2 times daily        07/03/21 1242    HYDROcodone-acetaminophen (NORCO/VICODIN) 5-325 MG tablet  Every 4 hours PRN        07/03/21 1242    ondansetron (ZOFRAN) 4 MG tablet  Every 8 hours PRN        07/03/21 1242            Clinical Impression: 1. BV (bacterial vaginosis)   2. Left adnexal tenderness   3. Skene's gland abscess   4. Left lower quadrant abdominal pain     Disposition: Discharge  Condition: Good  I have discussed the results, Dx and Tx plan with the pt(& family if present). He/she/they expressed understanding and agree(s) with the plan. Discharge instructions discussed at great length. Strict return precautions discussed and pt &/or family have verbalized understanding of the instructions. No further questions at time of discharge.    New Prescriptions   HYDROCODONE-ACETAMINOPHEN (NORCO/VICODIN) 5-325 MG TABLET    Take 2 tablets by mouth every 4 (four) hours as needed.   METRONIDAZOLE (FLAGYL) 500 MG TABLET    Take 1 tablet (500 mg total) by mouth 2 (two) times daily.   ONDANSETRON (ZOFRAN) 4 MG TABLET    Take 1 tablet (4 mg total) by mouth every 8 (eight) hours as  needed for nausea or vomiting.   SULFAMETHOXAZOLE-TRIMETHOPRIM (BACTRIM DS) 800-160 MG TABLET    Take 1 tablet by mouth 2 (two) times daily for 7 days.    Follow Up: No follow-up provider specified.     Jamiah Homeyer, Gwenyth Allegra, MD 07/03/21 1253

## 2021-07-03 NOTE — Discharge Instructions (Signed)
Your history, exam and work-up today are consistent with both bacterial vaginosis and an infection to your diverticulum/glands on the left side of your vagina because the purulence to come out during the exam.  I suspect these are contributing to your discomfort as the ultrasound was reassuring and ruled out torsion.  There is no other abscess or concerning findings seen in the abdomen and I suspect your pain may be related to the gland infection that could be a small abscess.  He did not have any external tenderness that would be amenable to drainage however I do feel is appropriate to have you follow-up with both urology and OB/GYN as well as a PCP.  Please take the antibiotics to help treat both the BV and the infection and follow-up as we discussed.  Please use the pain and nausea medicine to help with symptoms.  Please rest and stay hydrated.  If any symptoms change or worsen acutely, please return to the nearest emergency department.

## 2021-07-03 NOTE — ED Triage Notes (Signed)
Pt reports lower abdominal pain  since 2 am. Denies N,V,D

## 2021-07-04 LAB — GC/CHLAMYDIA PROBE AMP (~~LOC~~) NOT AT ARMC
Chlamydia: NEGATIVE
Comment: NEGATIVE
Comment: NORMAL
Neisseria Gonorrhea: NEGATIVE

## 2021-07-05 LAB — URINE CULTURE: Culture: >=100000 — AB

## 2021-07-06 ENCOUNTER — Telehealth (HOSPITAL_BASED_OUTPATIENT_CLINIC_OR_DEPARTMENT_OTHER): Payer: Self-pay | Admitting: *Deleted

## 2021-07-06 LAB — AEROBIC CULTURE W GRAM STAIN (SUPERFICIAL SPECIMEN): Gram Stain: NONE SEEN

## 2021-07-06 NOTE — Telephone Encounter (Signed)
Post ED Visit - Positive Culture Follow-up  Culture report reviewed by antimicrobial stewardship pharmacist: [facility] []  , Pharm.D. []  Enzo Bi, Pharm.D., BCPS AQ-ID []  , Pharm.D., BCPS []  Celedonio Miyamoto, .D., BCPS []  [place], .D., BCPS, AAHIVP []  Georgina Pillion, Pharm.D., BCPS, AAHIVP []  [address], PharmD, BCPS []  , PharmD, BCPS []  [place], PharmD, BCPS []  [address], PharmD []  , PharmD, BCPS [x]  Estella Husk, PharmD  Pharmacy Team []  Lysle Pearl, PharmD []  , PharmD []  Phillips Climes, PharmD []  , Rph []  Agapito Games) , PharmD []  Verlan Friends, PharmD []  , PharmD []  Mervyn Gay, PharmD []  , PharmD []  Conni Elliot, PharmD []  [facility], PharmD []  , PharmD []  Len Childs, PharmD   Positive urine culture Treated with Metronidazole, Sulfamethoxazole-Trimethoprim, organism sensitive to the same and no further patient follow-up is required at this time.  07/06/2021, 12:22 PM

## 2021-07-07 ENCOUNTER — Telehealth: Payer: Self-pay | Admitting: *Deleted

## 2021-07-07 NOTE — Telephone Encounter (Signed)
Post ED Visit - Positive Culture Follow-up  Culture report reviewed by antimicrobial stewardship pharmacist: [facility] []  Elenor Quinones, Pharm.D. [x]  Heide Guile, Pharm.D., BCPS AQ-ID []  Parks Neptune, Pharm.D., BCPS []  Alycia Rossetti, Pharm.D., BCPS []  [place], Pharm.D., BCPS, AAHIVP []  Legrand Como, Pharm.D., BCPS, AAHIVP []  Salome Arnt, PharmD, BCPS []  Johnnette Gourd, PharmD, BCPS []  Hughes Better, PharmD, BCPS []  Leeroy Cha, PharmD []  Laqueta Linden, PharmD, BCPS []  Albertina Parr, PharmD  [facility] Team []  Leodis Sias, PharmD []  Lindell Spar, PharmD []  Royetta Asal, PharmD []  Graylin Shiver, Rph []  Rema Fendt) Glennon Mac, PharmD []  Arlyn Dunning, PharmD []  Netta Cedars, PharmD []  Dia Sitter, PharmD []  Leone Haven, PharmD []  Gretta Arab, PharmD []  Theodis Shove, PharmD []  Peggyann Juba, PharmD []  Reuel Boom, PharmD   Positive wound culture Treated with Sulfamethoxazole-Trimethoprim, organism sensitive to the same and no further patient follow-up is required at this time.  Harlon Flor [facility] 07/07/2021, 10:12 AM

## 2021-08-10 ENCOUNTER — Other Ambulatory Visit: Payer: Self-pay

## 2021-08-10 ENCOUNTER — Inpatient Hospital Stay (HOSPITAL_COMMUNITY): Payer: PRIVATE HEALTH INSURANCE

## 2021-08-10 ENCOUNTER — Encounter (HOSPITAL_COMMUNITY): Payer: Self-pay | Admitting: Emergency Medicine

## 2021-08-10 ENCOUNTER — Inpatient Hospital Stay (HOSPITAL_COMMUNITY)
Admission: EM | Admit: 2021-08-10 | Discharge: 2021-08-10 | Disposition: A | Discharge status: Home / Self Care | Payer: PRIVATE HEALTH INSURANCE | Attending: Obstetrics and Gynecology | Admitting: Obstetrics and Gynecology

## 2021-08-10 DIAGNOSIS — N83201 Unspecified ovarian cyst, right side: Secondary | ICD-10-CM | POA: Insufficient documentation

## 2021-08-10 DIAGNOSIS — Z3A01 Less than 8 weeks gestation of pregnancy: Secondary | ICD-10-CM

## 2021-08-10 DIAGNOSIS — O30041 Twin pregnancy, dichorionic/diamniotic, first trimester: Secondary | ICD-10-CM | POA: Diagnosis not present

## 2021-08-10 DIAGNOSIS — Z6790 Unspecified blood type, Rh positive: Secondary | ICD-10-CM | POA: Diagnosis not present

## 2021-08-10 DIAGNOSIS — N8311 Corpus luteum cyst of right ovary: Secondary | ICD-10-CM | POA: Insufficient documentation

## 2021-08-10 DIAGNOSIS — O26899 Other specified pregnancy related conditions, unspecified trimester: Secondary | ICD-10-CM

## 2021-08-10 DIAGNOSIS — O26891 Other specified pregnancy related conditions, first trimester: Secondary | ICD-10-CM | POA: Diagnosis present

## 2021-08-10 DIAGNOSIS — O26851 Spotting complicating pregnancy, first trimester: Secondary | ICD-10-CM | POA: Diagnosis not present

## 2021-08-10 DIAGNOSIS — R109 Unspecified abdominal pain: Secondary | ICD-10-CM | POA: Insufficient documentation

## 2021-08-10 DIAGNOSIS — O3481 Maternal care for other abnormalities of pelvic organs, first trimester: Secondary | ICD-10-CM | POA: Diagnosis not present

## 2021-08-10 LAB — URINALYSIS, ROUTINE W REFLEX MICROSCOPIC
Bilirubin Urine: NEGATIVE
Glucose, UA: NEGATIVE mg/dL
Ketones, ur: NEGATIVE mg/dL
Nitrite: POSITIVE — AB
Protein, ur: 30 mg/dL — AB
Specific Gravity, Urine: 1.027 (ref 1.005–1.030)
pH: 6 (ref 5.0–8.0)

## 2021-08-10 LAB — CBC
HCT: 36 % (ref 36.0–46.0)
Hemoglobin: 12.9 g/dL (ref 12.0–15.0)
MCH: 30.9 pg (ref 26.0–34.0)
MCHC: 35.8 g/dL (ref 30.0–36.0)
MCV: 86.3 fL (ref 80.0–100.0)
Platelets: 213 10*3/uL (ref 150–400)
RBC: 4.17 MIL/uL (ref 3.87–5.11)
RDW: 13.2 % (ref 11.5–15.5)
WBC: 7.1 10*3/uL (ref 4.0–10.5)
nRBC: 0 % (ref 0.0–0.2)

## 2021-08-10 LAB — WET PREP, GENITAL
Sperm: NONE SEEN
Trich, Wet Prep: NONE SEEN
WBC, Wet Prep HPF POC: <10 (ref <10)
Yeast Wet Prep HPF POC: NONE SEEN

## 2021-08-10 LAB — HCG, QUANTITATIVE, PREGNANCY: hCG, Beta Chain, Quant, S: 303350 m[IU]/mL — ABNORMAL HIGH (ref <5)

## 2021-08-10 NOTE — ED Triage Notes (Signed)
Pt states she is [redacted] weeks pregnant.  Reports lower abd pain since last night with light vaginal bleeding.  States she had to wear a pad yesterday but currently spotting.  G2

## 2021-08-10 NOTE — MAU Provider Note (Signed)
History     CSN: [id number]  Arrival date and time: 08/10/21 1040   Event Date/Time   First Provider Initiated Contact with Patient 08/10/21 1101      Chief Complaint  Patient presents with   Vaginal Bleeding   Abdominal Pain   26 y.o. G2P1001 @[redacted]w[redacted]d  presenting with cramping and VB. Reports onset of light spotting when she first found out she was pregnant. Bleeding became heavier last night and required a pad. Bleeding is scant today. Cramping started last night. Rates 5/10. Has not treated. Denies urinary sx. Reports morning sickness with rare vomiting.   OB History     Gravida  2   Para  1   Term  1   Preterm      AB      Living  1      SAB      IAB      Ectopic      Multiple  0   Live Births  1           Past Medical History:  Diagnosis Date   Allergic rhinitis    seasonal   Anxiety    Chlamydia    Headache(784.0)    Infection    UTI   Urethral diverticulum     Past Surgical History:  Procedure Laterality Date   TONSILLECTOMY AND ADENOIDECTOMY  2001   TYMPANOSTOMY TUBE PLACEMENT  2001    Family History  Problem Relation Age of Onset   Migraines Mother    Migraines Maternal Grandmother    Cancer Maternal Grandmother        throat   Diabetes Paternal Grandfather    Cancer Paternal Grandfather        blood cancer    Social History   Tobacco Use   Smoking status: Every Day    Types: E-cigarettes, Cigarettes   Smokeless tobacco: Never   Tobacco comments:    June 2018  Vaping Use   Vaping Use: Every day   Substances: Nicotine  Substance Use Topics   Alcohol use: Yes    Alcohol/week: 0.0 standard drinks    Comment: rare   Drug use: No    Allergies: No Known Allergies  Medications Prior to Admission  Medication Sig Dispense Refill Last Dose   cephALEXin (KEFLEX) 500 MG capsule Take 1 capsule (500 mg total) by mouth 4 (four) times daily. (Patient not taking: Reported on 04/28/2018) 28 capsule 2    doxycycline (VIBRAMYCIN)  100 MG capsule Take 1 capsule (100 mg total) by mouth 2 (two) times daily. 20 capsule 0    erythromycin ophthalmic ointment Place a 1/2 inch ribbon of ointment into the lower eyelid. 3.5 g 0    HYDROcodone-acetaminophen (NORCO/VICODIN) 5-325 MG tablet Take 2 tablets by mouth every 4 (four) hours as needed. 10 tablet 0    lidocaine (XYLOCAINE) 5 % ointment Apply 1 application topically as needed. 35.44 g 1    metroNIDAZOLE (FLAGYL) 500 MG tablet Take 1 tablet (500 mg total) by mouth 2 (two) times daily. 14 tablet 0    norelgestromin-ethinyl estradiol (ORTHO EVRA) 150-35 MCG/24HR transdermal patch Place 1 patch onto the skin once a week. (Patient not taking: Reported on 01/10/2019) 3 patch 12    norelgestromin-ethinyl estradiol (XULANE) 150-35 MCG/24HR transdermal patch Place 1 patch onto the skin once a week. (Patient not taking: Reported on 07/11/2020) 3 patch 12    ondansetron (ZOFRAN) 4 MG tablet Take 1 tablet (4 mg total) by mouth every  8 (eight) hours as needed for nausea or vomiting. 12 tablet 0    oxyCODONE (ROXICODONE) 5 MG immediate release tablet Take 1 tablet (5 mg total) by mouth every 8 (eight) hours as needed for up to 10 doses for severe pain. 10 tablet 0     Review of Systems  Constitutional:  Negative for fever.  Gastrointestinal:  Positive for abdominal pain, nausea and vomiting. Negative for constipation and diarrhea.  Genitourinary:  Positive for vaginal bleeding. Negative for dysuria and urgency.  Physical Exam   Blood pressure 100/80, pulse 93, temperature 98.4 F (36.9 C), temperature source Oral, resp. rate 15, height 5' (1.524 m), weight 77.2 kg, last menstrual period 06/24/2021, SpO2 100 %.  Physical Exam Vitals and nursing note reviewed.  Constitutional:      General: She is not in acute distress.    Appearance: Normal appearance.  HENT:     Head: Normocephalic and atraumatic.  Cardiovascular:     Rate and Rhythm: Normal rate.  Pulmonary:     Effort: Pulmonary  effort is normal. No respiratory distress.  Abdominal:     General: There is no distension.     Palpations: Abdomen is soft. There is no mass.     Tenderness: There is no abdominal tenderness. There is no guarding or rebound.     Hernia: No hernia is present.  Musculoskeletal:        General: Normal range of motion.     Cervical back: Normal range of motion.  Skin:    General: Skin is warm and dry.  Neurological:     General: No focal deficit present.     Mental Status: She is alert and oriented to person, place, and time.  Psychiatric:        Mood and Affect: Mood normal.        Behavior: Behavior normal.   Results for orders placed or performed during the hospital encounter of 08/10/21 (from the past 24 hour(s))  Urinalysis, Routine w reflex microscopic Urine, Clean Catch     Status: Abnormal   Collection Time: 08/10/21 11:00 AM  Result Value Ref Range   Color, Urine AMBER (A) YELLOW   APPearance CLOUDY (A) CLEAR   Specific Gravity, Urine 1.027 1.005 - 1.030   pH 6.0 5.0 - 8.0   Glucose, UA NEGATIVE NEGATIVE mg/dL   Hgb urine dipstick SMALL (A) NEGATIVE   Bilirubin Urine NEGATIVE NEGATIVE   Ketones, ur NEGATIVE NEGATIVE mg/dL   Protein, ur 30 (A) NEGATIVE mg/dL   Nitrite POSITIVE (A) NEGATIVE   Leukocytes,Ua LARGE (A) NEGATIVE   RBC / HPF 6-10 0 - 5 RBC/hpf   WBC, UA 11-20 0 - 5 WBC/hpf   Bacteria, UA FEW (A) NONE SEEN   Squamous Epithelial / LPF 11-20 0 - 5   Mucus PRESENT   CBC     Status: None   Collection Time: 08/10/21 11:06 AM  Result Value Ref Range   WBC 7.1 4.0 - 10.5 K/uL   RBC 4.17 3.87 - 5.11 MIL/uL   Hemoglobin 12.9 12.0 - 15.0 g/dL   HCT 24.4 01.0 - 27.2 %   MCV 86.3 80.0 - 100.0 fL   MCH 30.9 26.0 - 34.0 pg   MCHC 35.8 30.0 - 36.0 g/dL   RDW 53.6 64.4 - 03.4 %   Platelets 213 150 - 400 K/uL   nRBC 0.0 0.0 - 0.2 %  Wet prep, genital     Status: Abnormal   Collection Time: 08/10/21  11:10 AM  Result Value Ref Range   Yeast Wet Prep HPF POC NONE SEEN  NONE SEEN   Trich, Wet Prep NONE SEEN NONE SEEN   Clue Cells Wet Prep HPF POC PRESENT (A) NONE SEEN   WBC, Wet Prep HPF POC <10 <10   Sperm NONE SEEN     US OB LESS THAN 14 WEEKS WITH OB TRANSVAGINAL  Result Date: 08/10/2021 CLINICAL DATA:  Abdominal pain EXAM: TWIN OBSTETRICAL ULTRASOUND <14 WKS TECHNIQUE: Transabdominal ultrasound was performed for evaluation of the gestation as well as the maternal uterus and adnexal regions. COMPARISON:  None. FINDINGS: Number of IUPs:  2 Chorionicity/Amnionicity:  Dichorionic-diamniotic (thick membrane) TWIN 1 Yolk sac:  Visualized. Embryo:  Visualized. Cardiac Activity: Visualized. Heart Rate: 135 bpm CRL: 8.0 mm   6 w 5 d                  [place] EDC: 03/31/2022 TWIN 2 Yolk sac:  Visualized. Embryo:  Visualized. Cardiac Activity: Visualized. Heart Rate: 137 bpm CRL:   7.3 mm   6 w 4 d                  [place] EDC: 04/01/2022 Subchorionic hemorrhage:  None visualized. Maternal uterus/adnexae: Right corpus luteum cyst. IMPRESSION: Dichorionic, diamniotic intrauterine twin gestation, sonographic gestational age of [redacted] weeks 5 days and 6 weeks 4 days. Electronically Signed   By: Jearld Lesch M.D.   On: 08/10/2021 12:24    MAU Course  Procedures  MDM Labs and [place] ordered and reviewed. Viable twin pregnancy on [place], no SCH. UA with nitrites and WBCs, no sx, will send UC. Stable for discharge home.   Assessment and Plan   1. [redacted] weeks gestation of pregnancy   2. Abdominal cramping affecting pregnancy   3. Dichorionic diamniotic twin pregnancy in first trimester   4. Blood type, Rh positive    Discharge home Follow up at [facility] in [place] to start care-has appt SAB precautions Start PNV  Allergies as of 08/10/2021   No Known Allergies      Medication List     STOP taking these medications    cephALEXin 500 MG capsule Commonly known as: KEFLEX   doxycycline 100 MG capsule Commonly known as: VIBRAMYCIN   erythromycin ophthalmic ointment    HYDROcodone-acetaminophen 5-325 MG tablet Commonly known as: NORCO/VICODIN   lidocaine 5 % ointment Commonly known as: XYLOCAINE   metroNIDAZOLE 500 MG tablet Commonly known as: FLAGYL   norelgestromin-ethinyl estradiol 150-35 MCG/24HR transdermal patch Commonly known as: XULANE   oxyCODONE 5 MG immediate release tablet Commonly known as: Roxicodone   Xulane 150-35 MCG/24HR transdermal patch Generic drug: norelgestromin-ethinyl estradiol       TAKE these medications    ondansetron 4 MG tablet Commonly known as: ZOFRAN Take 1 tablet (4 mg total) by mouth every 8 (eight) hours as needed for nausea or vomiting.       Donette Larry, CNM 08/10/2021, 12:47 PM

## 2021-08-10 NOTE — MAU Note (Signed)
Cramping in lower abd, spotting no-dark red. No recent intercourse.

## 2021-08-11 LAB — GC/CHLAMYDIA PROBE AMP (~~LOC~~) NOT AT ARMC
Chlamydia: NEGATIVE
Comment: NEGATIVE
Comment: NORMAL
Neisseria Gonorrhea: NEGATIVE

## 2021-08-12 LAB — CULTURE, OB URINE: Culture: >=100000 — AB

## 2021-08-13 ENCOUNTER — Other Ambulatory Visit (INDEPENDENT_AMBULATORY_CARE_PROVIDER_SITE_OTHER): Payer: Medicaid Other | Admitting: Advanced Practice Midwife

## 2021-08-13 ENCOUNTER — Telehealth: Payer: Self-pay | Admitting: Advanced Practice Midwife

## 2021-08-13 DIAGNOSIS — N390 Urinary tract infection, site not specified: Secondary | ICD-10-CM

## 2021-08-13 MED ORDER — CEFADROXIL 500 MG PO CAPS: 500.0000 mg | ORAL_CAPSULE | Freq: Two times a day (BID) | ORAL | 0 refills | Status: DC

## 2021-08-13 NOTE — Telephone Encounter (Signed)
Attempted to contact patient re: + UTI per urine culture. VLTCB  Clayton Bibles, MSA, MSN, CNM Certified Nurse Midwife, Biochemist, clinical for [employer], [facility]

## 2021-08-13 NOTE — Progress Notes (Signed)
Patient returned my voicemail. Identity confirmed x 2. Discussed + UTI per culture. Duricef safe in pregnancy. Confirmed NKDA and correct pharmacy. Patient denies questions at end of call.  Mallie Snooks, [place], MSN, CNM Certified Nurse Midwife, Product/process development scientist for [employer], [place]

## 2022-06-29 HISTORY — PX: THERAPEUTIC ABORTION: SHX798

## 2022-08-24 IMAGING — US US PELVIS COMPLETE WITH TRANSVAGINAL
1 series · 13 of 25 positions shown · non-contrast
Comparison: None

CLINICAL DATA: Paraumbilical abdominal pain.  Pelvic pain.



[Series 1: us pelvis complete with transvaginal · 79 acquisitions, 13 frames shown]
[im 1/79]
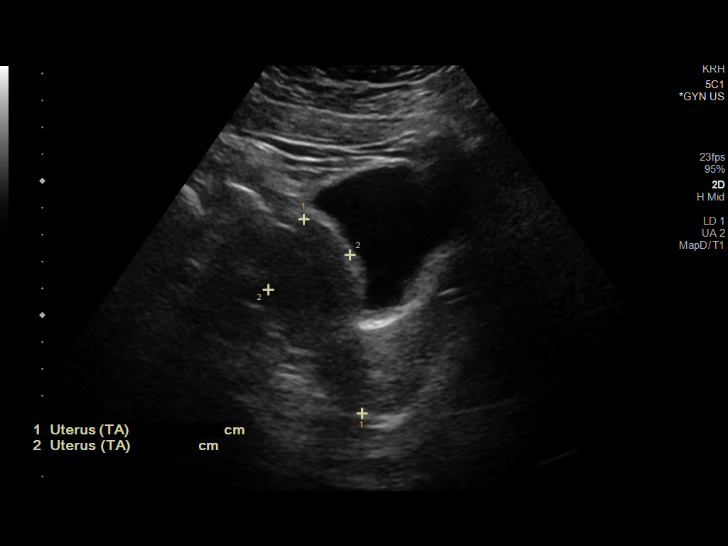
[im 7/79]
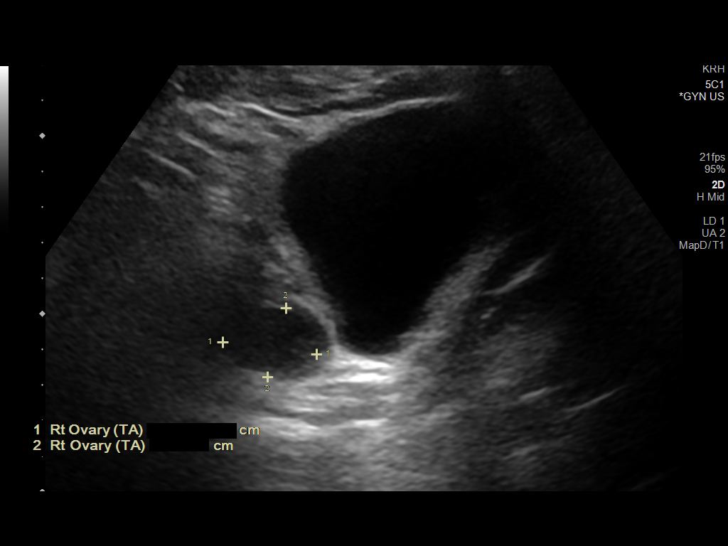
[im 14/79]
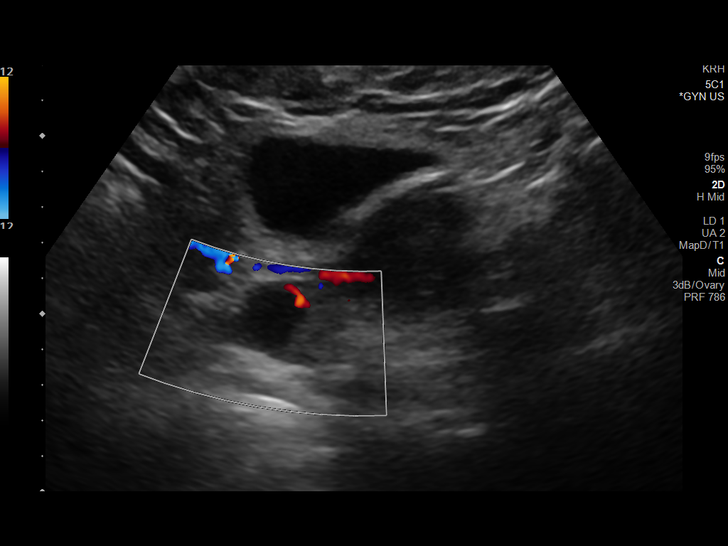
[im 20/79]
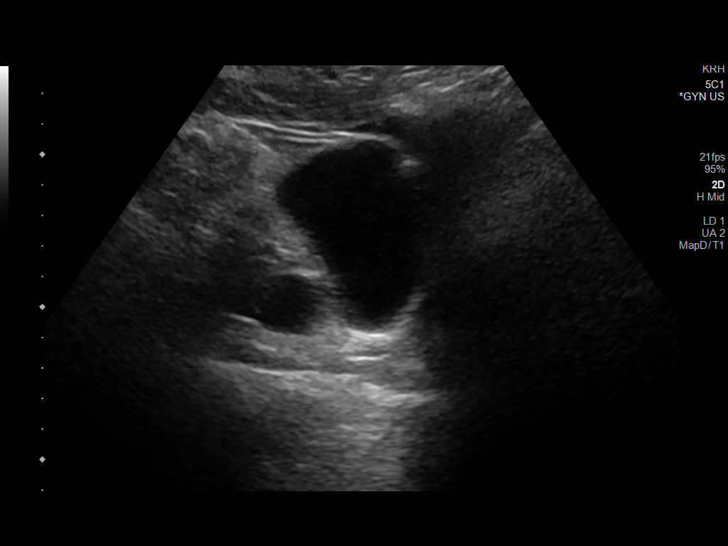
[im 27/79]
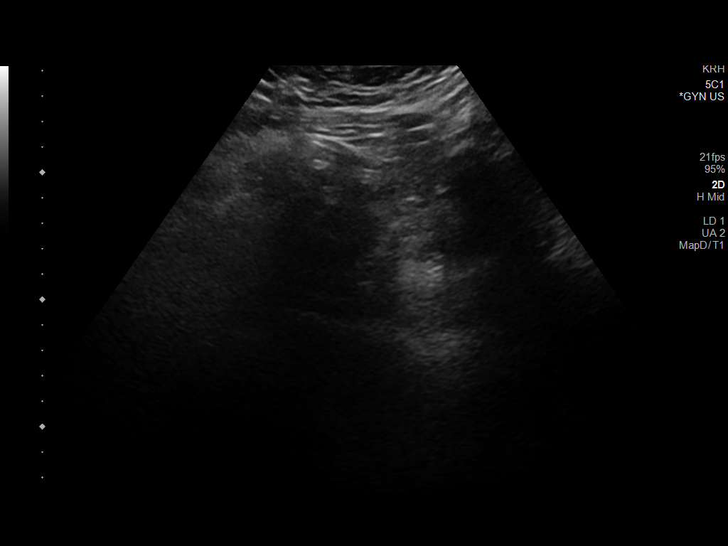
[im 33/79]
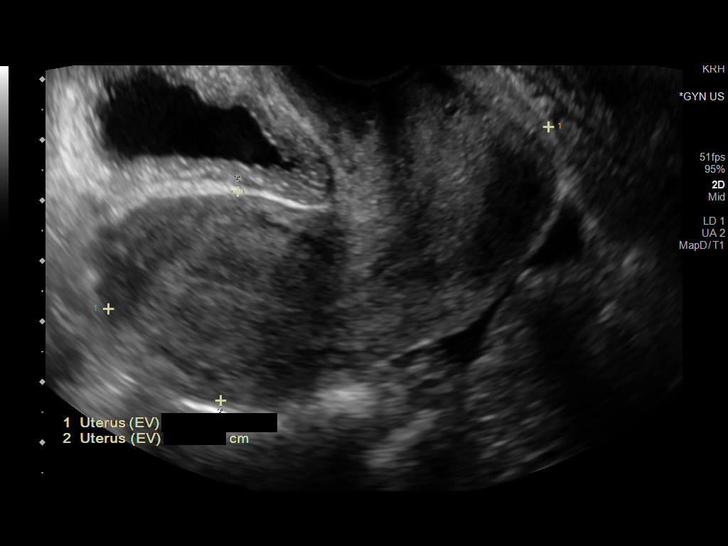
[im 40/79]
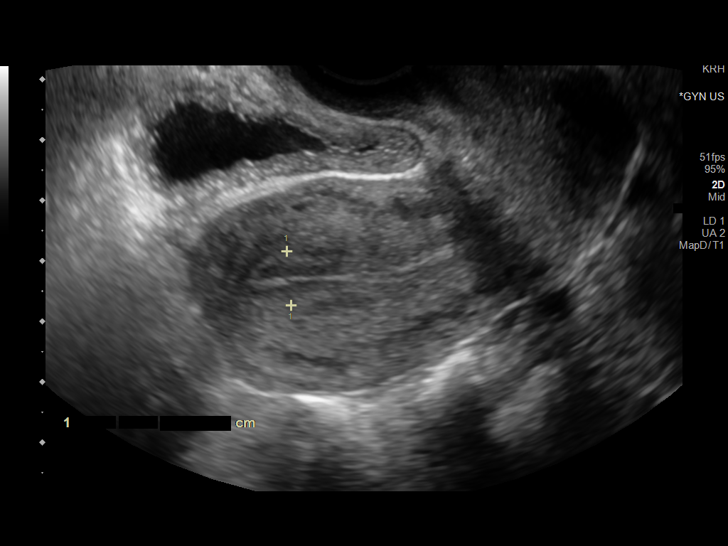
[im 46/79]
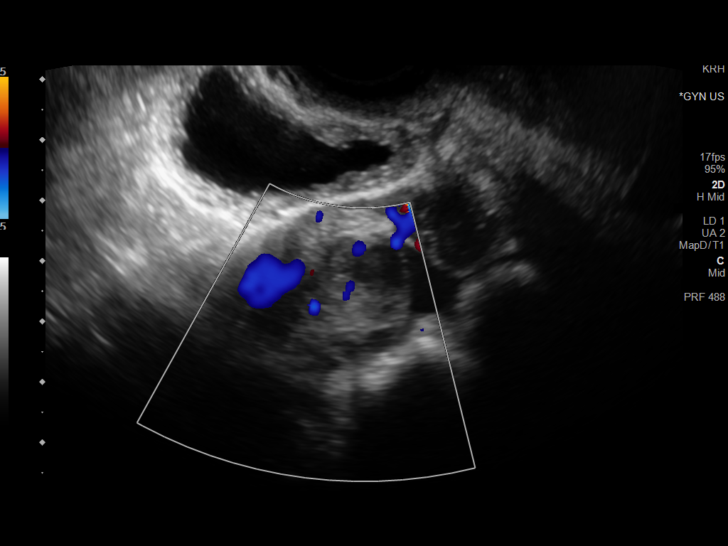
[im 53/79]
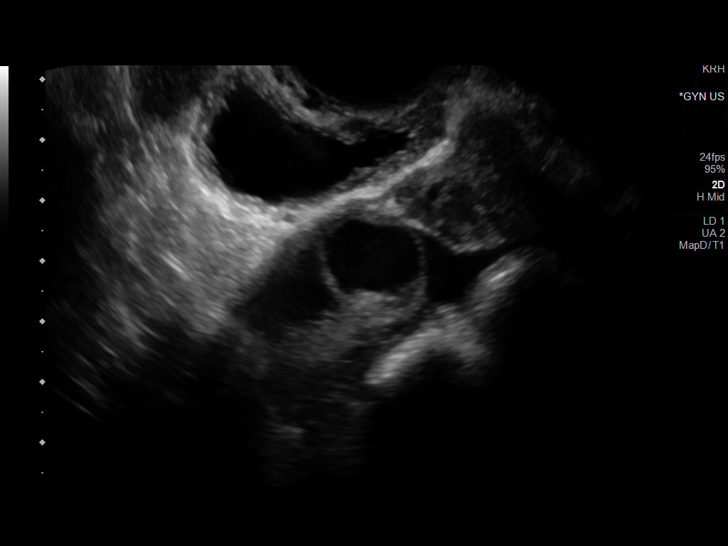
[im 59/79]
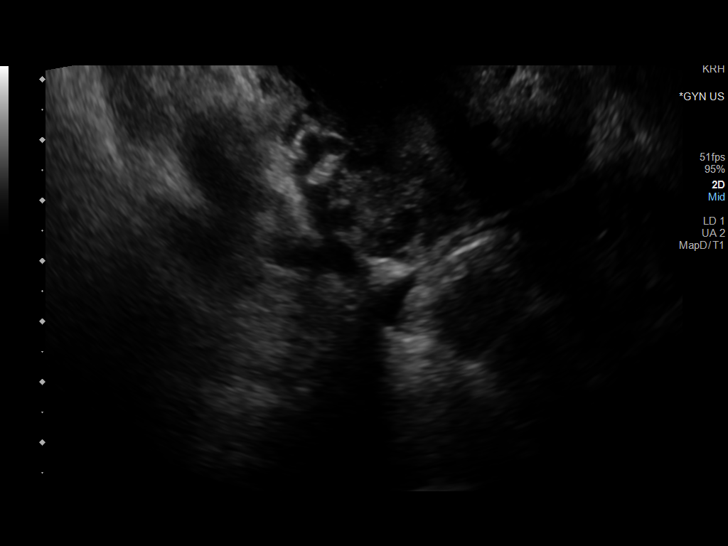
[im 66/79]
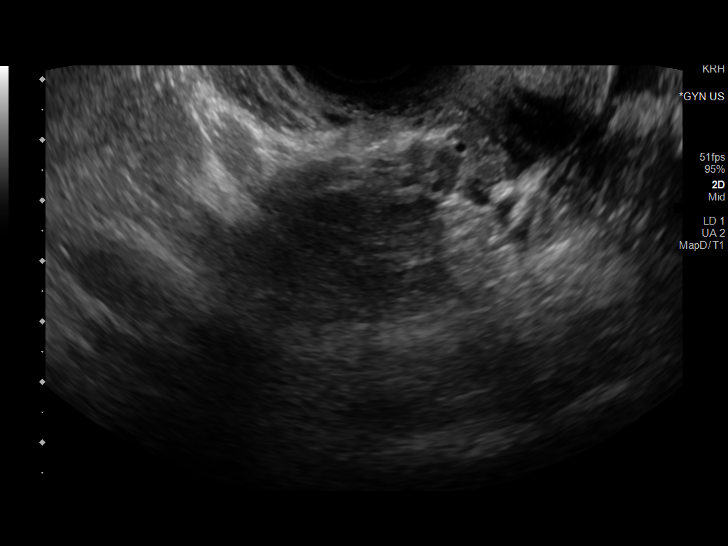
[im 72/79]
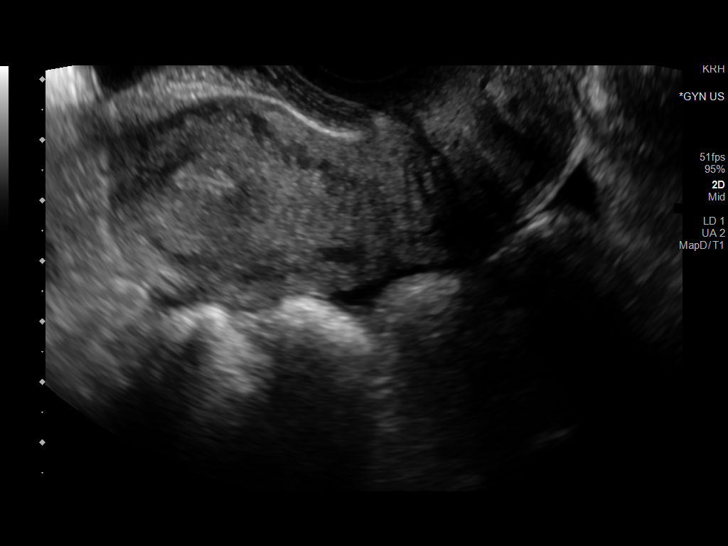
[im 79/79]
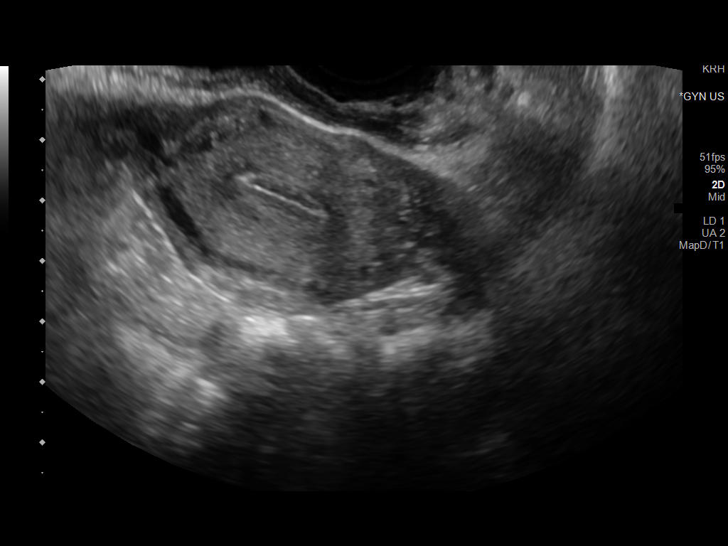

[13 of 25 positions shown; findings below may reference images not displayed]

FINDINGS: Uterus

Measurements: 7.9 x 3.5.2 cm = volume: 60 mL. No fibroids or other
mass visualized.

Endometrium

Thickness: 9.0.  No focal abnormality visualized.

Right ovary

Measurements: 3.8 x 2.5 x 2.8 cm = volume: 14 mL. Septated cyst
versus 2 adjacent follicles measures 3.1 x 2.0 x 2.1 cm.

Left ovary

Measurements: 3.0 x 1.9 x 1.8 cm = volume: 5.5 mL. Normal
appearance/no adnexal mass.

Other findings

Small amount of free fluid is likely physiologic.
IMPRESSION: 1. Complex right adnexal cyst measures 3.1 cm maximally. This [DATE] adjacent follicles. No follow-up imaging recommended.
Note: This recommendation does not apply to premenarchal patients
and to those with increased risk (genetic, family history, elevated
tumor markers or other high-risk factors) of ovarian cancer.
Reference: JACR [DATE]):[id number]
2. Normal sonographic appearance of the uterus and left adnexa.

## 2022-10-28 DIAGNOSIS — S8292XA Unspecified fracture of left lower leg, initial encounter for closed fracture: Secondary | ICD-10-CM

## 2022-10-28 HISTORY — DX: Unspecified fracture of left lower leg, initial encounter for closed fracture: S82.92XA

## 2022-12-23 IMAGING — US US PELVIS COMPLETE TRANSABD/TRANSVAG W DUPLEX
1 series · 13 of 25 positions shown · non-contrast
Comparison: CT 03/04/2021, ultrasound 11/03/2020

CLINICAL DATA: Pelvic pain.  Abnormal CT

EXAM:
TRANSABDOMINAL AND TRANSVAGINAL ULTRASOUND OF PELVIS
DOPPLER ULTRASOUND OF OVARIES
TECHNIQUE: Both transabdominal and transvaginal ultrasound examinations of the
pelvis were performed. Transabdominal technique was performed for
global imaging of the pelvis including uterus, ovaries, adnexal
regions, and pelvic cul-de-sac.
It was necessary to proceed with endovaginal exam following the
transabdominal exam to visualize the ovaries. Color and duplex
Doppler ultrasound was utilized to evaluate blood flow to the
ovaries.

[Series 1: us pelvis complete transabd/transvag w duplex · 13 of 94 slices shown]
[im 1/94]
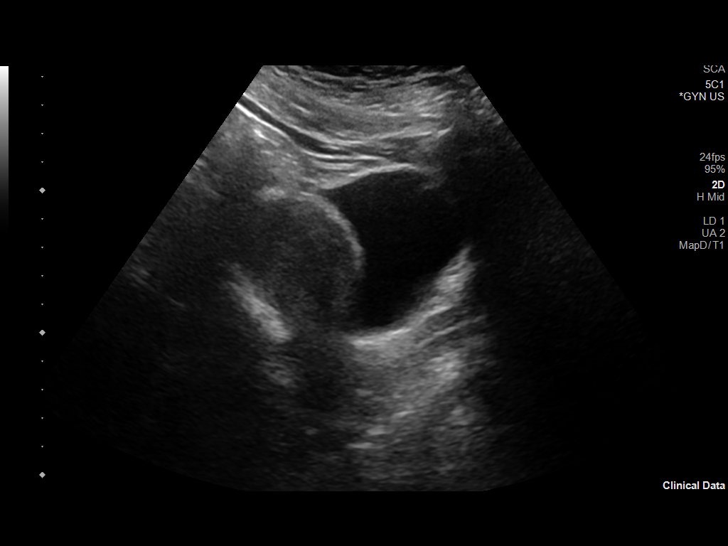
[im 8/94]
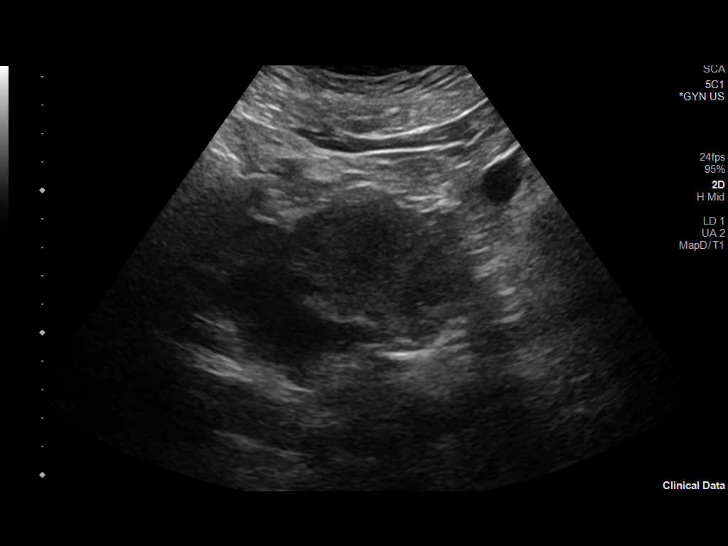
[im 16/94]
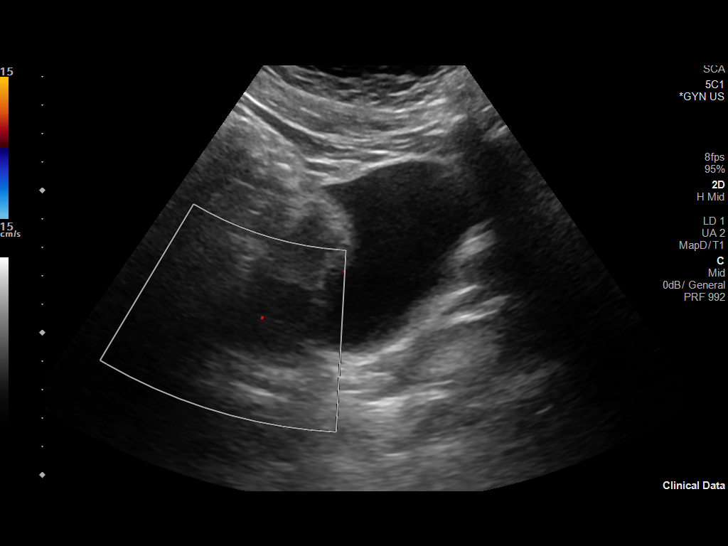
[im 24/94]
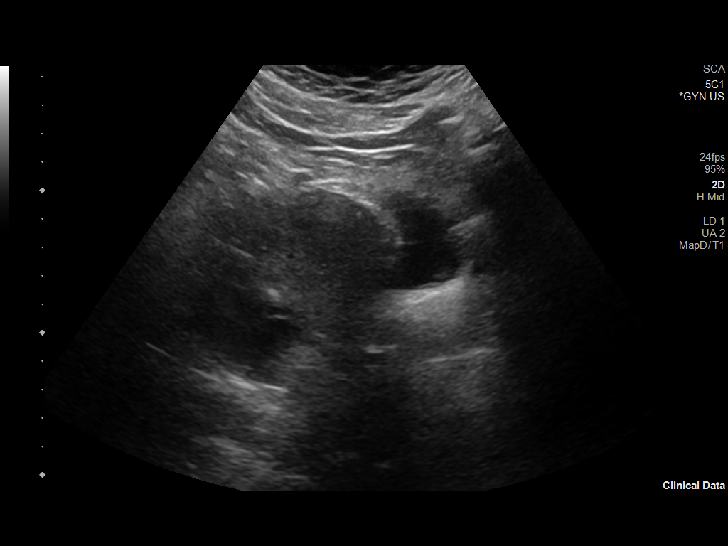
[im 32/94]
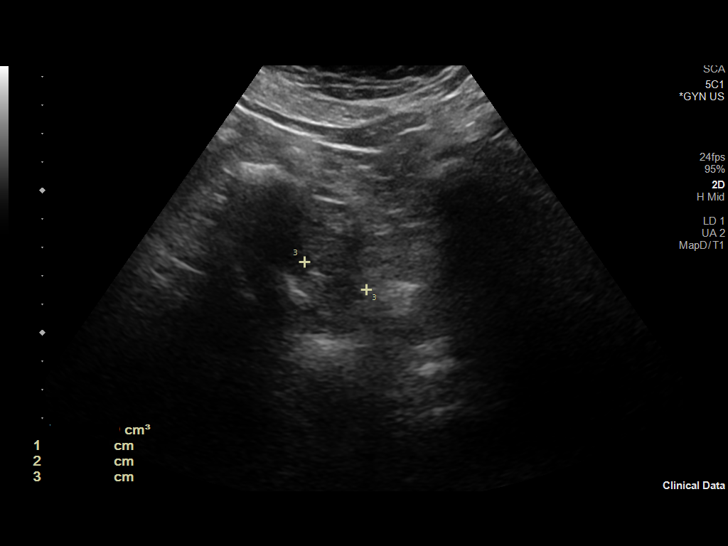
[im 39/94]
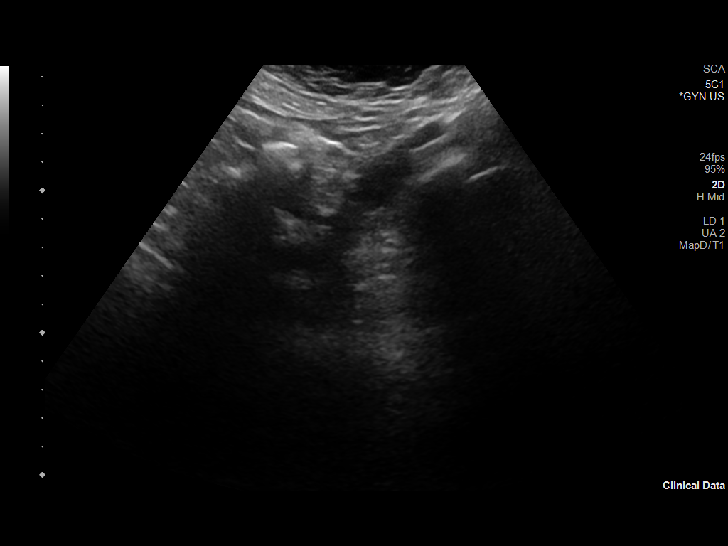
[im 47/94]
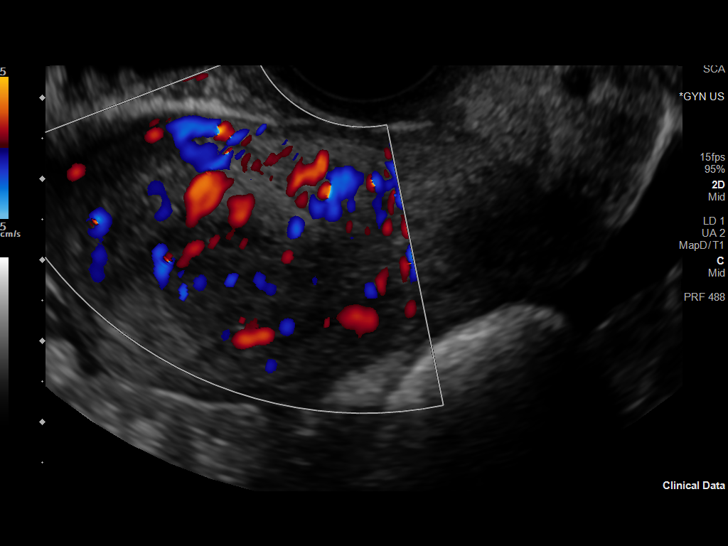
[im 55/94]
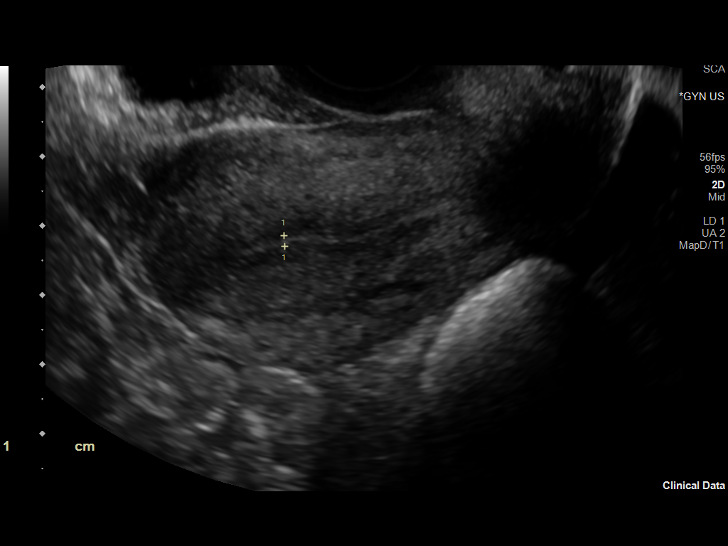
[im 63/94]
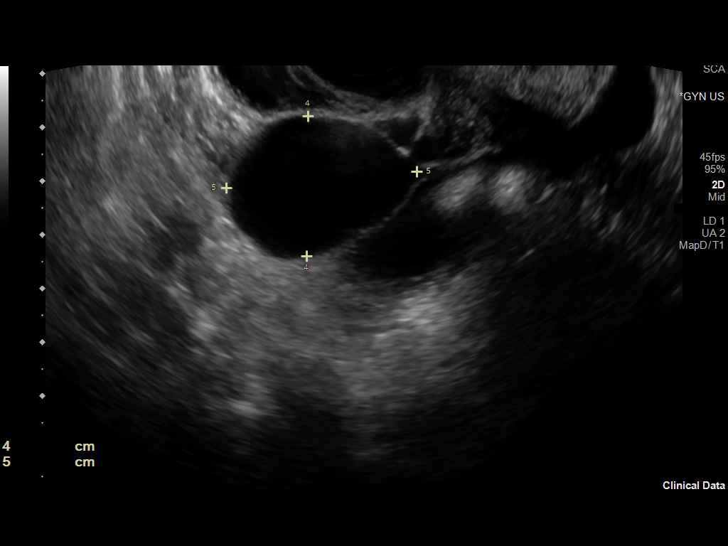
[im 70/94]
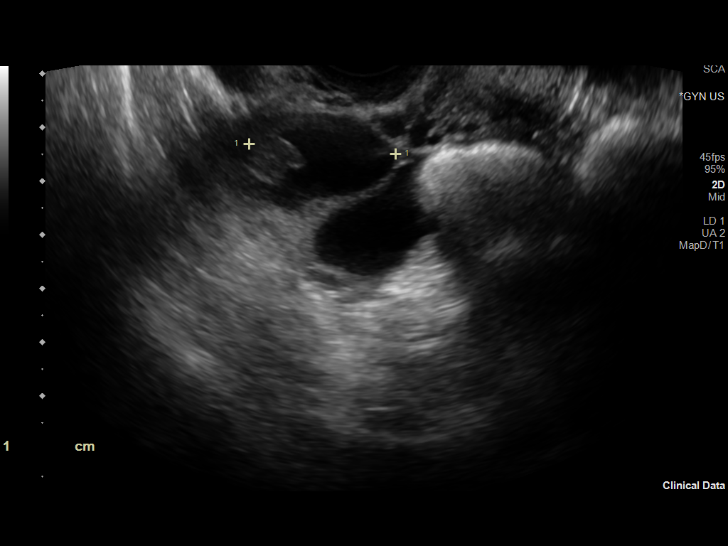
[im 78/94]
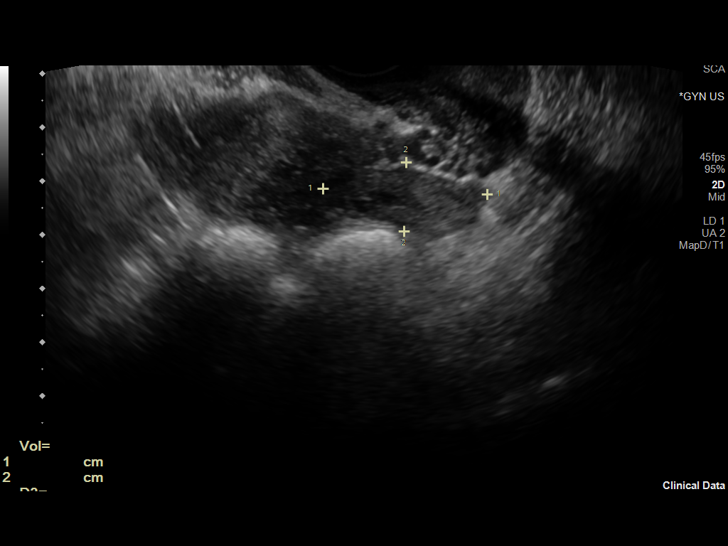
[im 86/94]
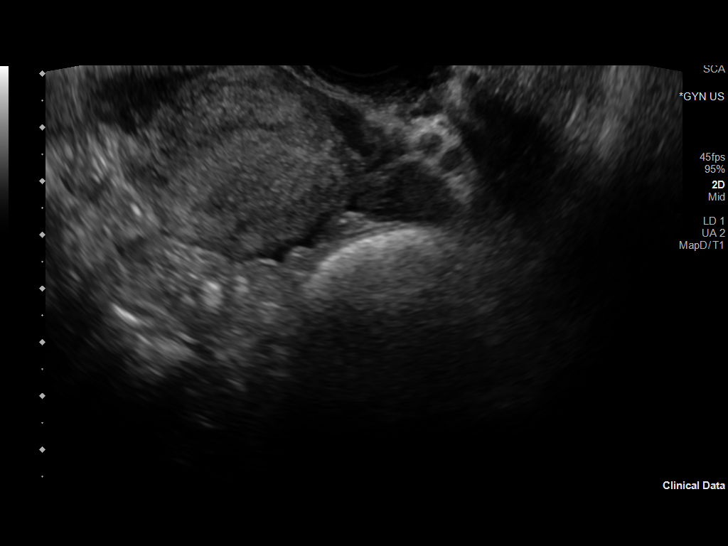
[im 94/94]
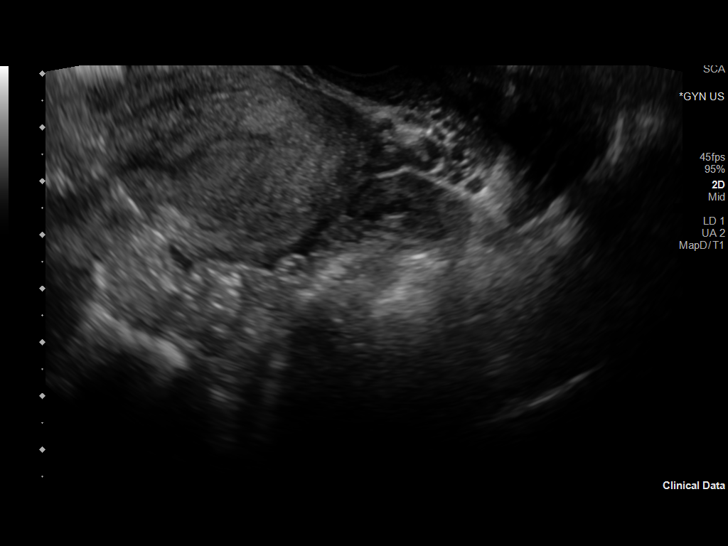

[13 of 25 positions shown; findings below may reference images not displayed]

FINDINGS: Uterus

Measurements: 7.5 x 3.5 x 4.3 cm = volume: 59 mL. No fibroids or
other mass visualized.

Endometrium

Thickness: 1.5 mm.  No focal abnormality visualized.

Right ovary

Measurements: 5.0 x 3.4 x 3.6 cm = volume: 32 mL. Two adjacent right
ovarian cysts measuring up to 2.7 cm and 2.2 cm, respectively.
Within the larger cyst there is a area of mural nodularity measuring
approximately 1.1 x 0.9 cm. No internal vascularity is seen within
this nodule on color Doppler. Findings may reflect a retractile clot
within a hemorrhagic cyst.

Left ovary

Measurements: 3.1 x 1.3 x 2.0 cm = volume: 4 mL. Normal
appearance/no adnexal mass.

Pulsed Doppler evaluation of both ovaries demonstrates normal
low-resistance arterial and venous waveforms.

Other findings

Small volume free fluid within the pelvis.
IMPRESSION: 1. Two adjacent right adnexal cysts measuring up to 2.7 cm. Within
the larger cyst is a 1.1 cm nonvascular area of nodularity. While
this could reflect a retractile clot within a hemorrhagic cyst, a
solid neoplasm is not excluded. Follow-up nonemergent MRI of the
pelvis with contrast is recommended. This recommendation follows the
consensus statement: Management of Asymptomatic Ovarian and Other
Adnexal Cysts Imaged at US: Society of Radiologists in Ultrasound
2. No evidence of adnexal torsion.
3. Small volume free fluid within the pelvis, likely physiologic.
4. Unremarkable appearance of the uterus and left ovary.

## 2023-04-23 IMAGING — US US PELVIS COMPLETE TRANSABD/TRANSVAG W DUPLEX AND/OR DOPPLER
1 series · 14 of 25 positions shown · non-contrast
Comparison: Pelvic sonogram dated March 04, 2021

CLINICAL DATA: Pelvic pain



[Series 1: us pelvis complete transabd/transvag w duplex and/ · 108 acquisitions, 14 frames shown]
[im 1/108]
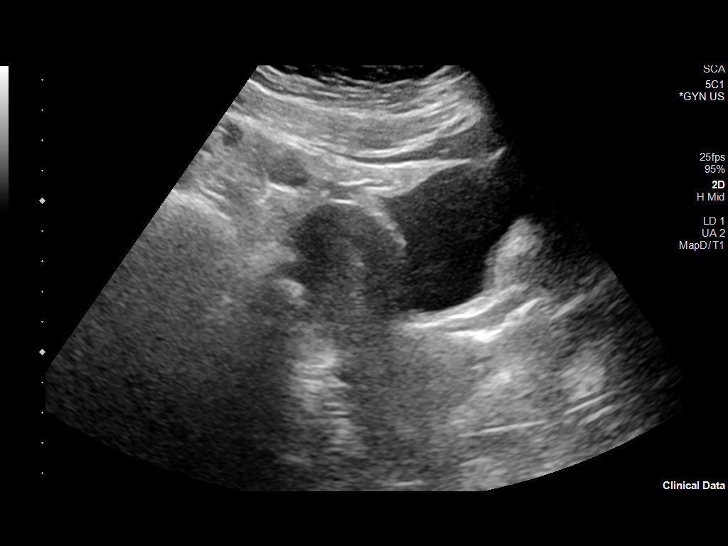
[im 9/108]
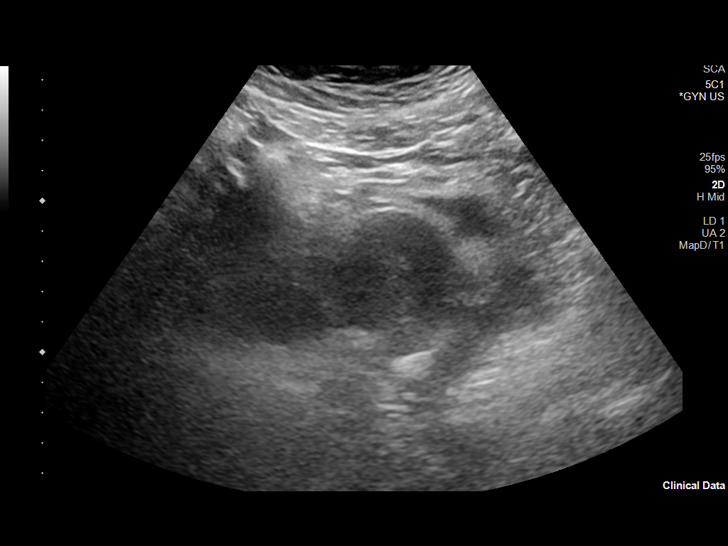
[im 18/108]
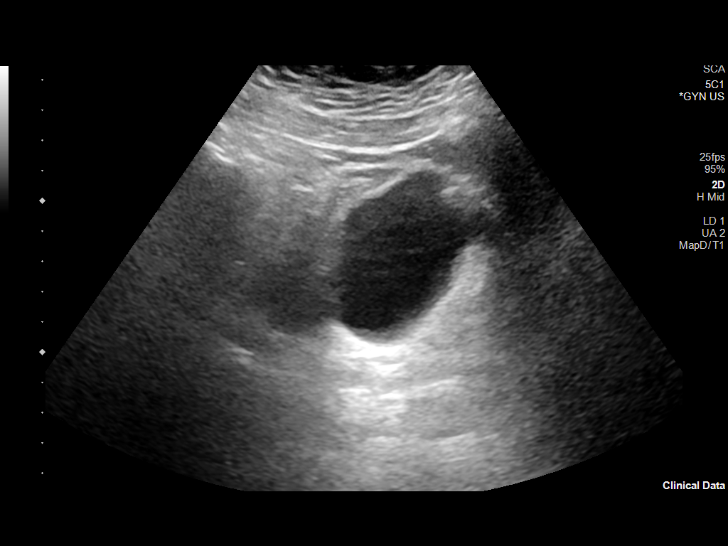
[im 27/108]
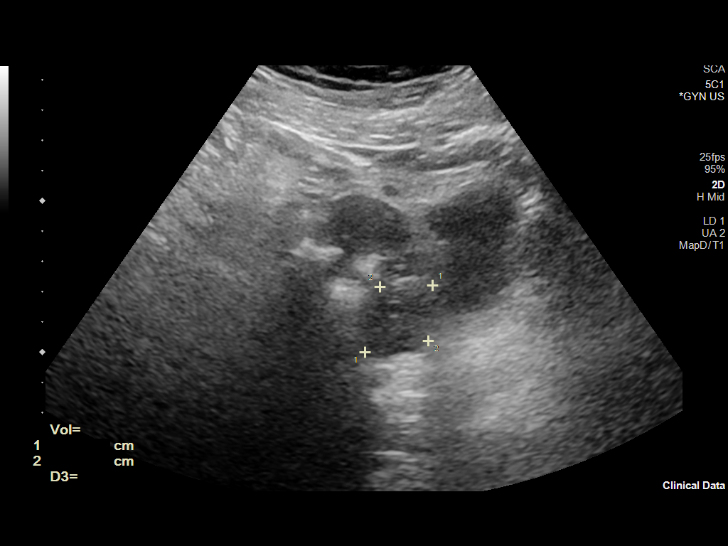
[im 36/108]
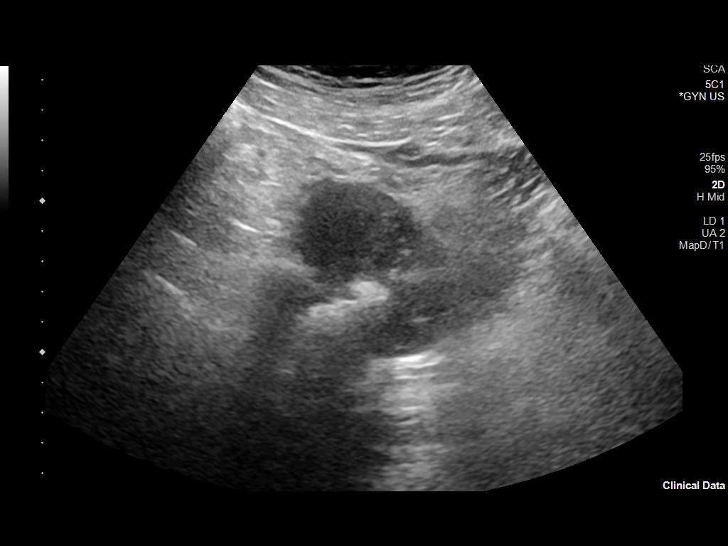
[im 41/108]
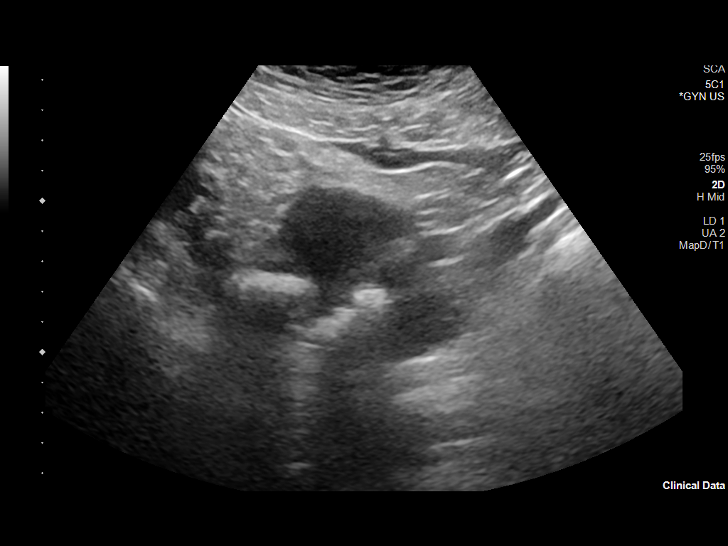
[im 50/108]
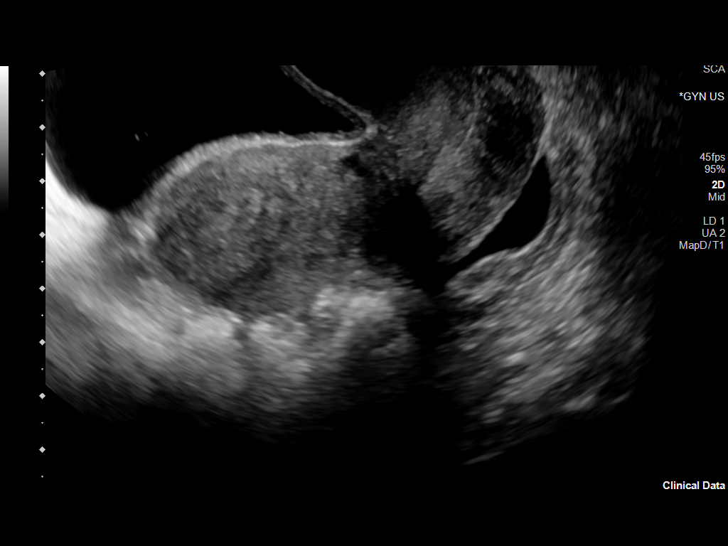
[im 58/108]
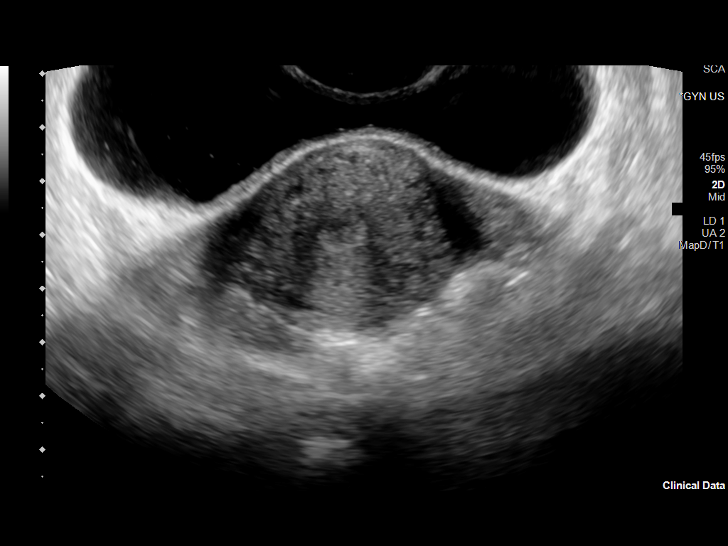
[im 67/108]
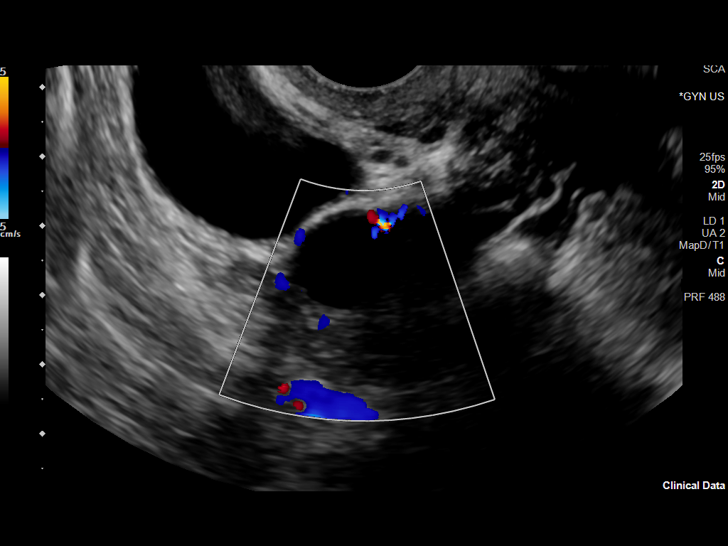
[im 72/108]
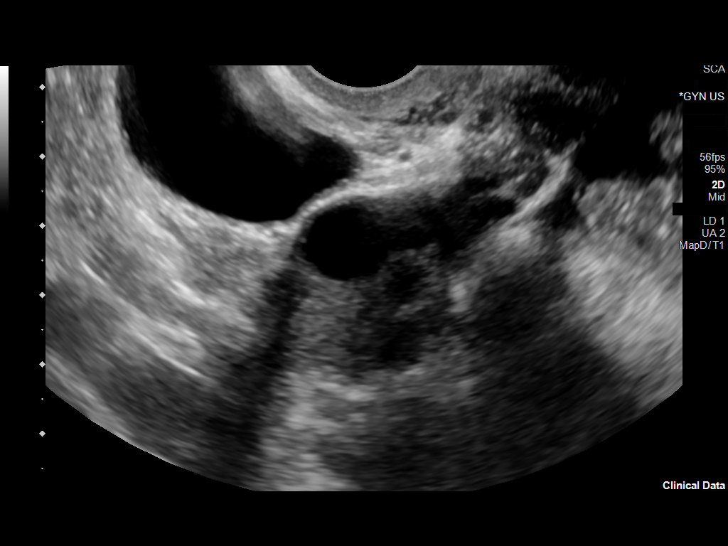
[im 81/108]
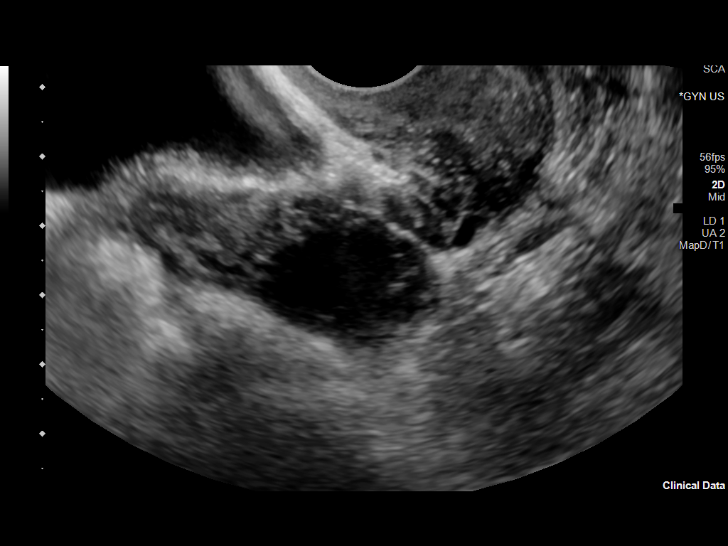
[im 90/108]
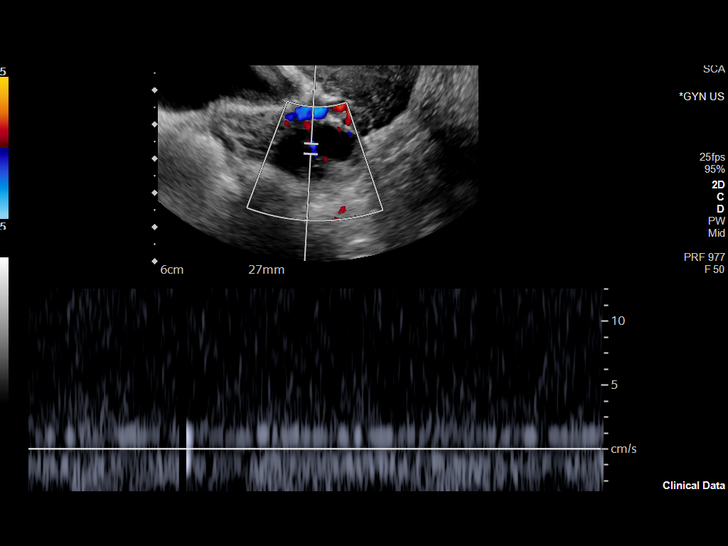
[im 99/108]
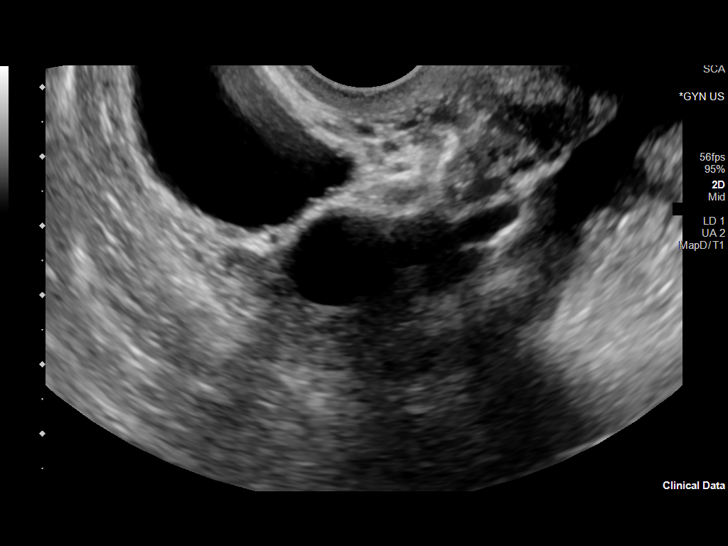
[im 108/108]
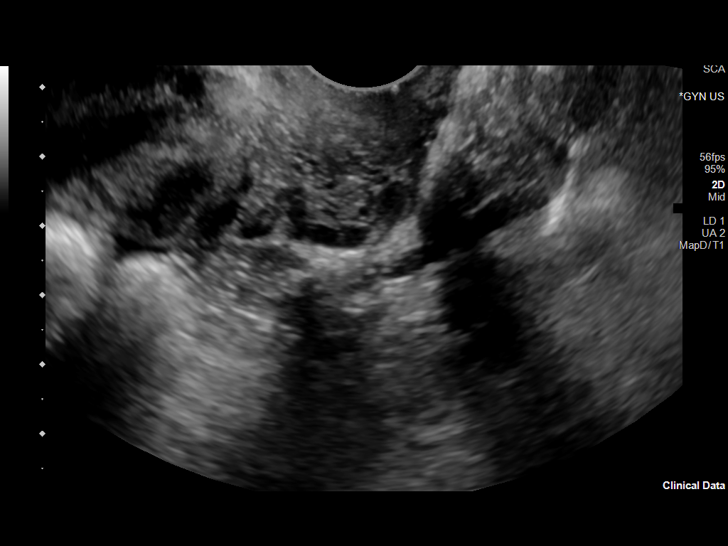

[14 of 25 positions shown; findings below may reference images not displayed]

FINDINGS: Uterus

Measurements: 8.2 x 2.9 x 3.9 = volume: 48.6 mL. No fibroids or
other mass visualized.

Endometrium

Thickness: 2 mm.  No focal abnormality visualized.

Right ovary

Measurements: 3.0 x 2.7 x 2.2 = volume: 9.3 mL. Normal appearance/no
adnexal mass. No appreciable nodularity or complex cysts seen.

Left ovary

Measurements: 2.4 x 2.8 x 2.1 = volume: 7.4 mL. Normal appearance/no
adnexal mass.

Other findings

No abnormal free fluid.
IMPRESSION: Normal examination.

## 2023-05-31 IMAGING — US US OB < 14 WEEKS - US OB TV
1 series · 15 of 28 positions shown · non-contrast
Comparison: None.

CLINICAL DATA: Abdominal pain

EXAM:
TWIN OBSTETRICAL ULTRASOUND <14 WKS
TECHNIQUE: Transabdominal ultrasound was performed for evaluation of the
gestation as well as the maternal uterus and adnexal regions.

[Series 1: us ob < 14 weeks - us ob tv · 29 acquisitions, 15 frames shown]
[im 1/29]
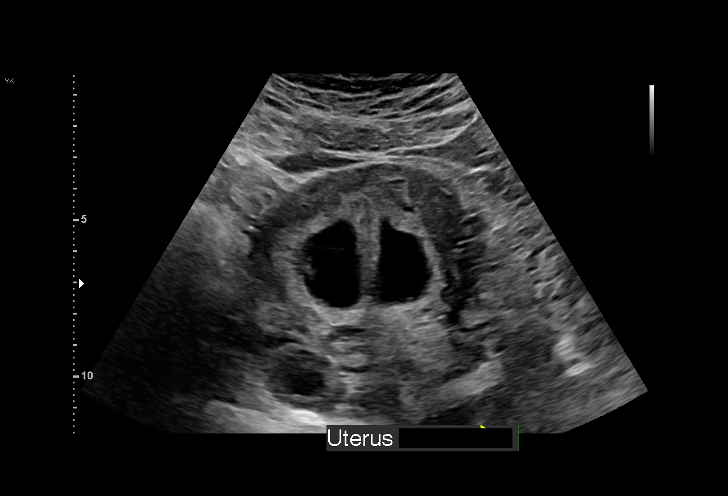
[im 3/29]
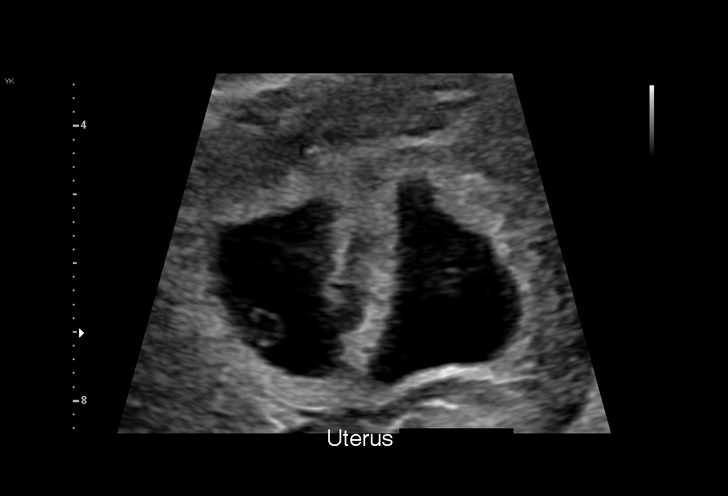
[im 5/29]
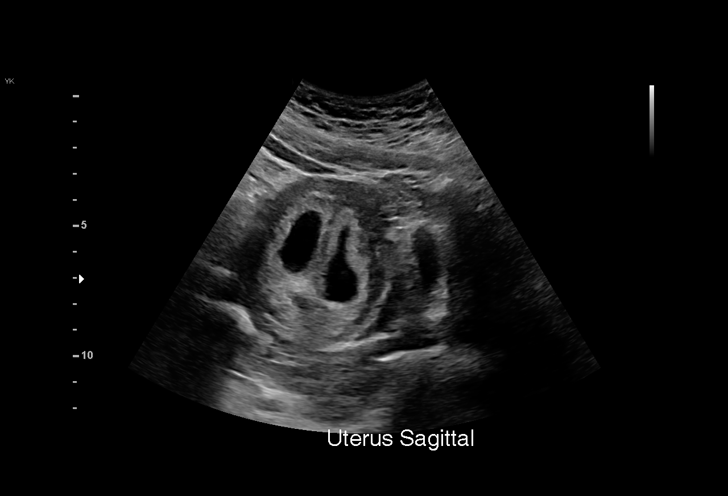
[im 7/29]
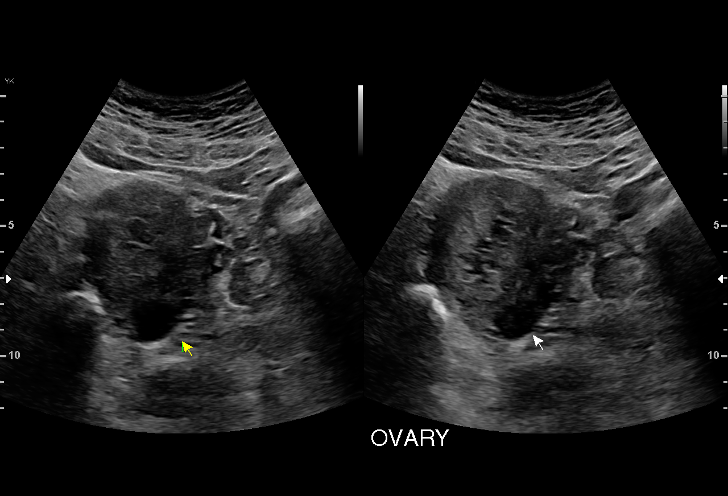
[im 9/29]
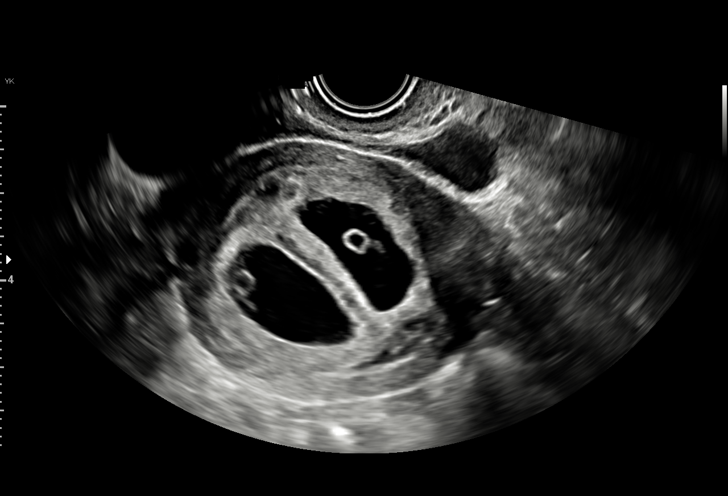
[im 11/29]
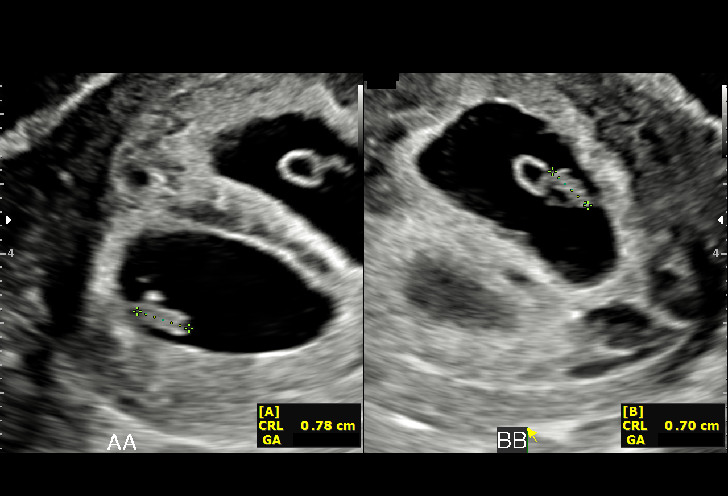
[im 13/29]
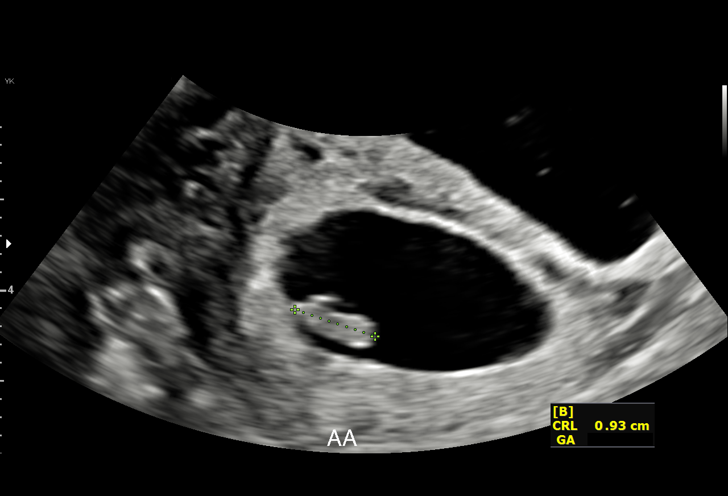
[im 15/29]
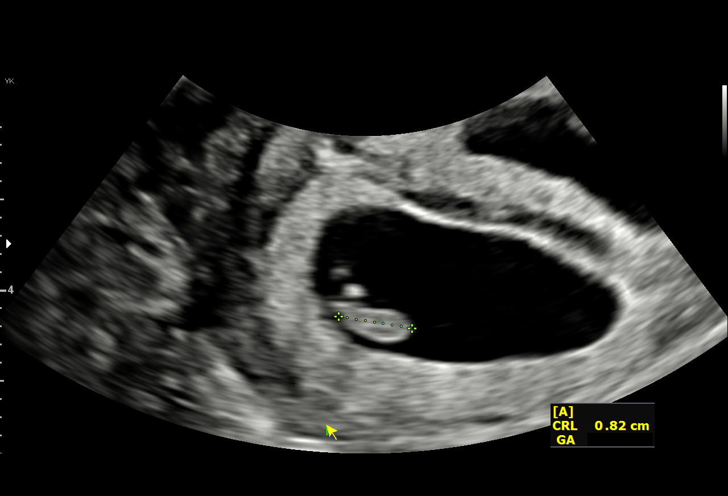
[im 16/29]
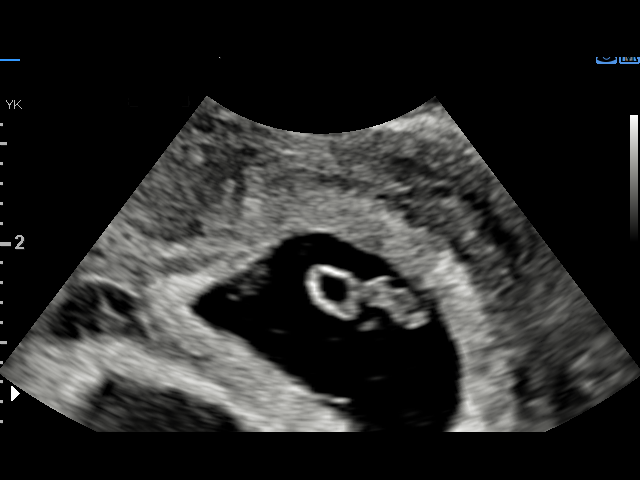
[im 18/29]
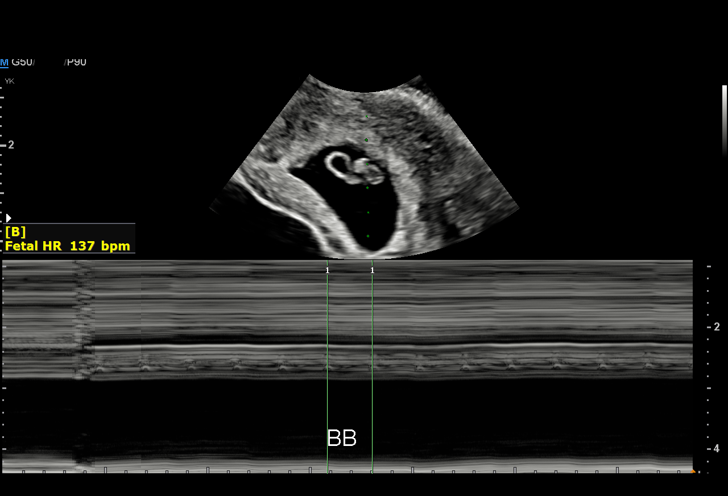
[im 20/29]
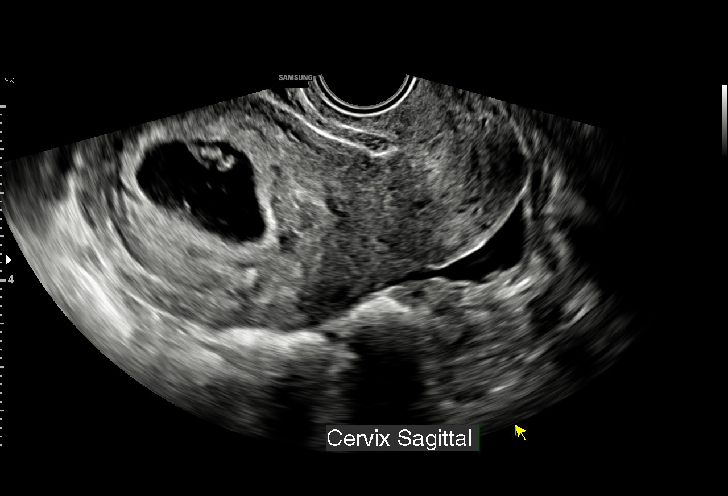
[im 22/29]
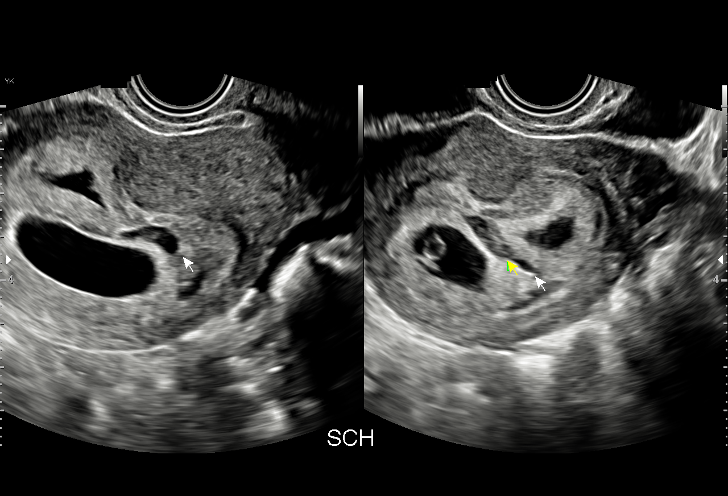
[im 24/29]
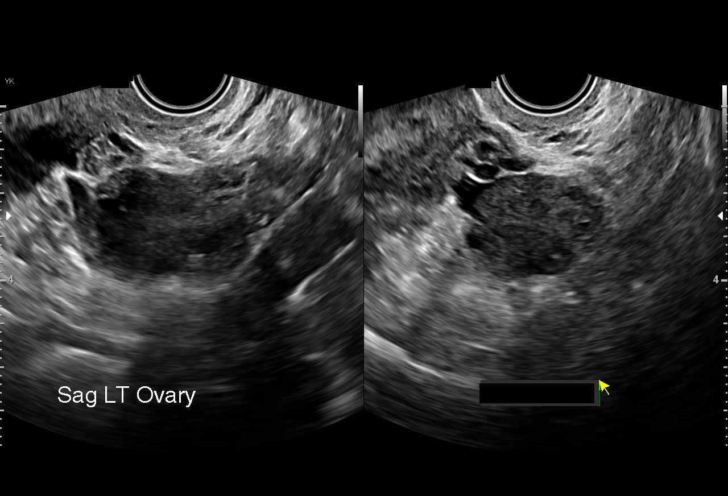
[im 26/29]
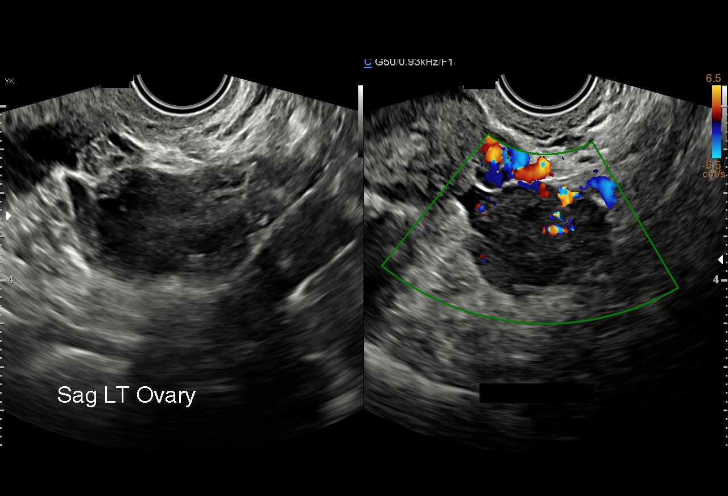
[im 29/29]
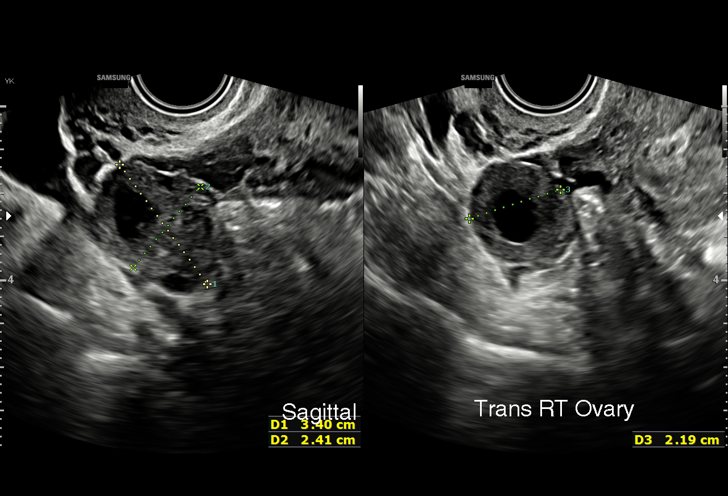

[15 of 28 positions shown; findings below may reference images not displayed]

FINDINGS: Number of IUPs:  2

Chorionicity/Amnionicity:  Dichorionic-diamniotic (thick membrane)

TWIN 1

Yolk sac:  Visualized.

Embryo:  Visualized.

Cardiac Activity: Visualized.

Heart Rate: 135 bpm

CRL: 8.0 mm   6 w 5 d                  US EDC: 03/31/2022

TWIN 2

Yolk sac:  Visualized.

Embryo:  Visualized.

Cardiac Activity: Visualized.

Heart Rate: 137 bpm

CRL:   7.3 mm   6 w 4 d                  US EDC: 04/01/2022

Subchorionic hemorrhage:  None visualized.

Maternal uterus/adnexae: Right corpus luteum cyst.
IMPRESSION: Dichorionic, diamniotic intrauterine twin gestation, sonographic
gestational age of 6 weeks 5 days and 6 weeks 4 days.

## 2023-07-11 ENCOUNTER — Emergency Department (HOSPITAL_BASED_OUTPATIENT_CLINIC_OR_DEPARTMENT_OTHER)
Admission: EM | Admit: 2023-07-11 | Discharge: 2023-07-11 | Disposition: A | Discharge status: Home / Self Care | Payer: PRIVATE HEALTH INSURANCE | Attending: Student | Admitting: Student

## 2023-07-11 ENCOUNTER — Encounter (HOSPITAL_BASED_OUTPATIENT_CLINIC_OR_DEPARTMENT_OTHER): Payer: Self-pay | Admitting: Emergency Medicine

## 2023-07-11 ENCOUNTER — Other Ambulatory Visit: Payer: Self-pay

## 2023-07-11 DIAGNOSIS — R200 Anesthesia of skin: Secondary | ICD-10-CM | POA: Diagnosis present

## 2023-07-11 DIAGNOSIS — F17210 Nicotine dependence, cigarettes, uncomplicated: Secondary | ICD-10-CM | POA: Insufficient documentation

## 2023-07-11 DIAGNOSIS — G629 Polyneuropathy, unspecified: Secondary | ICD-10-CM

## 2023-07-11 MED ORDER — LIDOCAINE 5 % EX PTCH: 1.0000 | MEDICATED_PATCH | CUTANEOUS | 0 refills | Status: DC

## 2023-07-11 MED ORDER — GABAPENTIN 100 MG PO CAPS
100.0000 mg | ORAL_CAPSULE | Freq: Once | ORAL | Status: AC
  Administered 2023-07-11: 100 mg via ORAL
  Filled 2023-07-11: qty 1

## 2023-07-11 MED ORDER — LIDOCAINE 5 % EX PTCH
1.0000 | MEDICATED_PATCH | CUTANEOUS | Status: DC
  Administered 2023-07-11: 1 via TRANSDERMAL
  Filled 2023-07-11: qty 1

## 2023-07-11 MED ORDER — GABAPENTIN 100 MG PO CAPS
100.0000 mg | ORAL_CAPSULE | Freq: Three times a day (TID) | ORAL | 0 refills | Status: DC | PRN
Start: 2023-07-11 — End: 2023-11-25
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
## 2023-07-11 NOTE — ED Triage Notes (Signed)
 Pt had surgery to left leg in May.  Pt had plates and screws in place.  Pt states her nerves were waking up after surgery but for the last two weeks she has been having an increase in pain.  No known fever.  Pt has some numbness in her 1st and 2nd toes.  Denies recent injury.  Good pedal pulse.

## 2023-11-25 ENCOUNTER — Inpatient Hospital Stay (HOSPITAL_BASED_OUTPATIENT_CLINIC_OR_DEPARTMENT_OTHER)
Admission: EM | Admit: 2023-11-25 | Discharge: 2023-11-29 | DRG: 389 | Disposition: A | Discharge status: Home / Self Care | Attending: Internal Medicine | Admitting: Internal Medicine

## 2023-11-25 ENCOUNTER — Inpatient Hospital Stay (HOSPITAL_COMMUNITY)

## 2023-11-25 ENCOUNTER — Encounter (HOSPITAL_BASED_OUTPATIENT_CLINIC_OR_DEPARTMENT_OTHER): Payer: Self-pay | Admitting: Emergency Medicine

## 2023-11-25 ENCOUNTER — Emergency Department (HOSPITAL_BASED_OUTPATIENT_CLINIC_OR_DEPARTMENT_OTHER)

## 2023-11-25 ENCOUNTER — Other Ambulatory Visit: Payer: Self-pay

## 2023-11-25 DIAGNOSIS — D72819 Decreased white blood cell count, unspecified: Secondary | ICD-10-CM | POA: Diagnosis present

## 2023-11-25 DIAGNOSIS — E669 Obesity, unspecified: Secondary | ICD-10-CM | POA: Diagnosis present

## 2023-11-25 DIAGNOSIS — G43909 Migraine, unspecified, not intractable, without status migrainosus: Secondary | ICD-10-CM | POA: Diagnosis present

## 2023-11-25 DIAGNOSIS — K56609 Unspecified intestinal obstruction, unspecified as to partial versus complete obstruction: Principal | ICD-10-CM

## 2023-11-25 DIAGNOSIS — R609 Edema, unspecified: Secondary | ICD-10-CM | POA: Diagnosis present

## 2023-11-25 DIAGNOSIS — Z79899 Other long term (current) drug therapy: Secondary | ICD-10-CM

## 2023-11-25 DIAGNOSIS — K2289 Other specified disease of esophagus: Secondary | ICD-10-CM | POA: Diagnosis present

## 2023-11-25 DIAGNOSIS — E876 Hypokalemia: Secondary | ICD-10-CM | POA: Diagnosis present

## 2023-11-25 DIAGNOSIS — R1032 Left lower quadrant pain: Secondary | ICD-10-CM | POA: Diagnosis present

## 2023-11-25 DIAGNOSIS — K2970 Gastritis, unspecified, without bleeding: Secondary | ICD-10-CM | POA: Diagnosis present

## 2023-11-25 DIAGNOSIS — G629 Polyneuropathy, unspecified: Secondary | ICD-10-CM | POA: Diagnosis present

## 2023-11-25 DIAGNOSIS — N83201 Unspecified ovarian cyst, right side: Secondary | ICD-10-CM | POA: Diagnosis present

## 2023-11-25 DIAGNOSIS — N8110 Cystocele, unspecified: Secondary | ICD-10-CM | POA: Diagnosis present

## 2023-11-25 DIAGNOSIS — K56600 Partial intestinal obstruction, unspecified as to cause: Secondary | ICD-10-CM | POA: Diagnosis present

## 2023-11-25 DIAGNOSIS — Z6833 Body mass index (BMI) 33.0-33.9, adult: Secondary | ICD-10-CM | POA: Diagnosis not present

## 2023-11-25 DIAGNOSIS — Z833 Family history of diabetes mellitus: Secondary | ICD-10-CM | POA: Diagnosis not present

## 2023-11-25 DIAGNOSIS — F419 Anxiety disorder, unspecified: Secondary | ICD-10-CM | POA: Diagnosis present

## 2023-11-25 DIAGNOSIS — F17210 Nicotine dependence, cigarettes, uncomplicated: Secondary | ICD-10-CM | POA: Diagnosis present

## 2023-11-25 DIAGNOSIS — N390 Urinary tract infection, site not specified: Secondary | ICD-10-CM | POA: Diagnosis present

## 2023-11-25 DIAGNOSIS — F17290 Nicotine dependence, other tobacco product, uncomplicated: Secondary | ICD-10-CM | POA: Diagnosis present

## 2023-11-25 DIAGNOSIS — Z604 Social exclusion and rejection: Secondary | ICD-10-CM | POA: Diagnosis present

## 2023-11-25 DIAGNOSIS — K2900 Acute gastritis without bleeding: Secondary | ICD-10-CM | POA: Diagnosis not present

## 2023-11-25 DIAGNOSIS — N3000 Acute cystitis without hematuria: Secondary | ICD-10-CM

## 2023-11-25 DIAGNOSIS — R109 Unspecified abdominal pain: Secondary | ICD-10-CM | POA: Insufficient documentation

## 2023-11-25 LAB — CBC
HCT: 40.9 % (ref 36.0–46.0)
Hemoglobin: 14.3 g/dL (ref 12.0–15.0)
MCH: 29.7 pg (ref 26.0–34.0)
MCHC: 35 g/dL (ref 30.0–36.0)
MCV: 84.9 fL (ref 80.0–100.0)
Platelets: 243 10*3/uL (ref 150–400)
RBC: 4.82 MIL/uL (ref 3.87–5.11)
RDW: 13.3 % (ref 11.5–15.5)
WBC: 6.1 10*3/uL (ref 4.0–10.5)
nRBC: 0 % (ref 0.0–0.2)

## 2023-11-25 LAB — COMPREHENSIVE METABOLIC PANEL WITH GFR
ALT: 11 U/L (ref 0–44)
AST: 18 U/L (ref 15–41)
Albumin: 4.9 g/dL (ref 3.5–5.0)
Alkaline Phosphatase: 80 U/L (ref 38–126)
Anion gap: 12 (ref 5–15)
BUN: 13 mg/dL (ref 6–20)
CO2: 22 mmol/L (ref 22–32)
Calcium: 9.7 mg/dL (ref 8.9–10.3)
Chloride: 106 mmol/L (ref 98–111)
Creatinine, Ser: 0.72 mg/dL (ref 0.44–1.00)
GFR, Estimated: >60 mL/min (ref >60)
Glucose, Bld: 83 mg/dL (ref 70–99)
Potassium: 3.4 mmol/L — ABNORMAL LOW (ref 3.5–5.1)
Sodium: 139 mmol/L (ref 135–145)
Total Bilirubin: 0.5 mg/dL (ref 0.0–1.2)
Total Protein: 8.1 g/dL (ref 6.5–8.1)

## 2023-11-25 LAB — URINALYSIS, ROUTINE W REFLEX MICROSCOPIC
Bilirubin Urine: NEGATIVE
Glucose, UA: NEGATIVE mg/dL
Hgb urine dipstick: NEGATIVE
Ketones, ur: 40 mg/dL — AB
Nitrite: POSITIVE — AB
Protein, ur: NEGATIVE mg/dL
Specific Gravity, Urine: >=1.03 (ref 1.005–1.030)
pH: 6 (ref 5.0–8.0)

## 2023-11-25 LAB — URINALYSIS, MICROSCOPIC (REFLEX)

## 2023-11-25 LAB — LIPASE, BLOOD: Lipase: 39 U/L (ref 11–51)

## 2023-11-25 LAB — HCG, SERUM, QUALITATIVE: Preg, Serum: NEGATIVE

## 2023-11-25 MED ORDER — MORPHINE SULFATE (PF) 4 MG/ML IV SOLN
4.0000 mg | Freq: Once | INTRAVENOUS | Status: AC
  Administered 2023-11-25: 4 mg via INTRAVENOUS
  Filled 2023-11-25: qty 1

## 2023-11-25 MED ORDER — ACETAMINOPHEN 325 MG PO TABS
650.0000 mg | ORAL_TABLET | Freq: Four times a day (QID) | ORAL | Status: DC | PRN
  Administered 2023-11-26: 650 mg via ORAL
  Filled 2023-11-25: qty 2

## 2023-11-25 MED ORDER — ONDANSETRON HCL 4 MG/2ML IJ SOLN
4.0000 mg | Freq: Once | INTRAMUSCULAR | Status: AC
  Administered 2023-11-25: 4 mg via INTRAVENOUS
  Filled 2023-11-25: qty 2

## 2023-11-25 MED ORDER — KCL IN DEXTROSE-NACL 20-5-0.45 MEQ/L-%-% IV SOLN
INTRAVENOUS | Status: AC
Start: 2023-11-25 — End: 2023-11-26
  Filled 2023-11-25 (×4): qty 1000

## 2023-11-25 MED ORDER — SODIUM CHLORIDE 0.9 % IV BOLUS
1000.0000 mL | Freq: Once | INTRAVENOUS | Status: AC
  Administered 2023-11-25: 1000 mL via INTRAVENOUS

## 2023-11-25 MED ORDER — ENOXAPARIN SODIUM 40 MG/0.4ML IJ SOSY
40.0000 mg | PREFILLED_SYRINGE | INTRAMUSCULAR | Status: DC
  Administered 2023-11-25 – 2023-11-28 (×4): 40 mg via SUBCUTANEOUS
  Filled 2023-11-25 (×5): qty 0.4

## 2023-11-25 MED ORDER — MORPHINE SULFATE (PF) 2 MG/ML IV SOLN
1.0000 mg | INTRAVENOUS | Status: DC | PRN
  Administered 2023-11-25 (×2): 1 mg via INTRAVENOUS
  Filled 2023-11-25 (×2): qty 1

## 2023-11-25 MED ORDER — IOHEXOL 300 MG/ML  SOLN
100.0000 mL | Freq: Once | INTRAMUSCULAR | Status: AC | PRN
  Administered 2023-11-25: 100 mL via INTRAVENOUS

## 2023-11-25 MED ORDER — MORPHINE SULFATE (PF) 2 MG/ML IV SOLN
2.0000 mg | INTRAVENOUS | Status: DC | PRN
  Administered 2023-11-25 – 2023-11-27 (×8): 2 mg via INTRAVENOUS
  Filled 2023-11-25 (×8): qty 1

## 2023-11-25 MED ORDER — ONDANSETRON HCL 4 MG/2ML IJ SOLN
4.0000 mg | Freq: Four times a day (QID) | INTRAMUSCULAR | Status: DC | PRN
  Administered 2023-11-25 – 2023-11-27 (×6): 4 mg via INTRAVENOUS
  Filled 2023-11-25 (×6): qty 2

## 2023-11-25 MED ORDER — DIATRIZOATE MEGLUMINE & SODIUM 66-10 % PO SOLN
90.0000 mL | Freq: Once | ORAL | Status: AC
  Administered 2023-11-25: 90 mL via ORAL
  Filled 2023-11-25: qty 90

## 2023-11-25 MED ORDER — SODIUM CHLORIDE 0.9 % IV SOLN
2.0000 g | Freq: Once | INTRAVENOUS | Status: AC
  Administered 2023-11-25: 2 g via INTRAVENOUS
  Filled 2023-11-25: qty 20

## 2023-11-25 MED ORDER — GABAPENTIN 100 MG PO CAPS
100.0000 mg | ORAL_CAPSULE | Freq: Three times a day (TID) | ORAL | Status: DC
  Administered 2023-11-25 – 2023-11-28 (×12): 100 mg via ORAL
  Filled 2023-11-25 (×12): qty 1

## 2023-11-25 NOTE — ED Provider Notes (Signed)
 Received patient in turnover from Dr. Harless Lien.  Please see their note for further details of Hx, PE.  Briefly patient is a 28 y.o. female with a Abdominal Pain .  Patient with left lower quadrant pain that radiates to the back going on for a couple days now.  Found to have likely urinary tract infection presumed pyelonephritis by history CT imaging unfortunately was concerning for small bowel obstruction with a transition point.  Plan for admission.    Albertus Hughs, DO 11/25/23 0930

## 2023-11-25 NOTE — ED Triage Notes (Signed)
 Abdominal pain, radiates to back, with nausea since 0400 yesterday.

## 2023-11-25 NOTE — ED Notes (Signed)
 Patient transported to CT

## 2023-11-25 NOTE — ED Notes (Signed)
 Report given to [facility] RN at [phone]. Unable to place care hand off note in chart without retiming due to inpatient admission

## 2023-11-25 NOTE — Plan of Care (Signed)
  Problem: Pain Managment: Goal: General experience of comfort will improve and/or be controlled Outcome: Progressing   Problem: Safety: Goal: Ability to remain free from injury will improve Outcome: Progressing   Problem: Skin Integrity: Goal: Risk for impaired skin integrity will decrease Outcome: Progressing

## 2023-11-25 NOTE — ED Notes (Signed)
 LAb aware to send urine specimen for culture

## 2023-11-25 NOTE — H&P (Signed)
 History and Physical    Christina Kennedy ZOX:[id number] DOB: Jan 25, 1996 DOA: 11/25/2023  PCP: Patient, No Pcp Per  Patient coming from: home by way of [facility] ER   Chief Complaint: abdominal pain  HPI: Christina Kennedy is a 28 y.o. female with medical history significant for migraine bladder prolapse presenting with the above.  Symptoms began yesterday morning. First time this has happened. Llq pain radiating to the back. With associated nausea, no vomiting. Constant. Passing flatus, last stool was 2 days ago. No history abdominal surgery or problems with bowel obstruction. No dysuria or increased frequency or urgency or fever.    Review of Systems: As per HPI otherwise 10 point review of systems negative.    Past Medical History:  Diagnosis Date   Allergic rhinitis    seasonal   Anxiety    Chlamydia    Headache(784.0)    Infection    UTI   Urethral diverticulum     Past Surgical History:  Procedure Laterality Date   FRACTURE SURGERY     TONSILLECTOMY AND ADENOIDECTOMY  2001   TYMPANOSTOMY TUBE PLACEMENT  2001     reports that she has been smoking e-cigarettes and cigarettes. She has never used smokeless tobacco. She reports current alcohol use. She reports that she does not use drugs.  No Known Allergies  Family History  Problem Relation Age of Onset   Migraines Mother    Migraines Maternal Grandmother    Cancer Maternal Grandmother        throat   Diabetes Paternal Grandfather    Cancer Paternal Grandfather        blood cancer    Prior to Admission medications   Medication Sig Start Date End Date Taking? Authorizing Provider  amitriptyline (ELAVIL) 10 MG tablet Take 1 tablet by mouth at bedtime. 09/14/22  Yes [provider]  SUMAtriptan  (IMITREX ) 50 MG tablet Take 50 mg by mouth. 09/14/22  Yes [provider]  cefadroxil  (DURICEF) 500 MG capsule Take 1 capsule (500 mg total) by mouth 2 (two) times daily. 08/13/21   Weinhold, Samantha C, CNM   gabapentin  (NEURONTIN ) 100 MG capsule Take 1 capsule (100 mg total) by mouth 3 (three) times daily as needed. 07/11/23 08/10/23  Kommor, Madison, MD  lidocaine  (LIDODERM ) 5 % Place 1 patch onto the skin daily. Remove & Discard patch within 12 hours or as directed by MD 07/11/23   Kommor, Alyse July, MD  ondansetron  (ZOFRAN ) 4 MG tablet Take 1 tablet (4 mg total) by mouth every 8 (eight) hours as needed for nausea or vomiting. 07/03/21   Tegeler, Marine Sia, MD    Physical Exam: Vitals:   11/25/23 0715 11/25/23 0800 11/25/23 0830 11/25/23 1009  BP: 126/78 112/79 114/78 114/69  Pulse: 94 83 81 87  Resp: 18 17 16 17   Temp:    98.5 F (36.9 C)  TempSrc:    Oral  SpO2: 100% 97% 97% 100%  Weight:      Height:        Constitutional: No acute distress Head: Atraumatic Eyes: Conjunctiva clear ENM: Moist mucous membranes. Normal dentition.  Respiratory: Clear to auscultation bilaterally Cardiovascular: Regular rate and rhythm. No murmurs/rubs/gallops. Abdomen: non-distended, ttp left side, no rebound. Decreased bowel sounds Musculoskeletal: No joint deformity upper and lower extremities.  Skin: No rashes, lesions, or ulcers.  Extremities: No peripheral edema. Palpable peripheral pulses. Neurologic: Alert, moving all 4 extremities. Psychiatric: Normal insight and judgement.   Labs on Admission: I have personally reviewed following labs  and imaging studies  CBC: Recent Labs  Lab 11/25/23 0446  WBC 6.1  HGB 14.3  HCT 40.9  MCV 84.9  PLT 243   Basic Metabolic Panel: Recent Labs  Lab 11/25/23 0446  NA 139  K 3.4*  CL 106  CO2 22  GLUCOSE 83  BUN 13  CREATININE 0.72  CALCIUM 9.7   GFR: Estimated Creatinine Clearance: 101.9 mL/min (by C-G formula based on SCr of 0.72 mg/dL). Liver Function Tests: Recent Labs  Lab 11/25/23 0446  AST 18  ALT 11  ALKPHOS 80  BILITOT 0.5  PROT 8.1  ALBUMIN 4.9   Recent Labs  Lab 11/25/23 0446  LIPASE 39   No results for input(s):  "AMMONIA" in the last 168 hours. Coagulation Profile: No results for input(s): "INR", "PROTIME" in the last 168 hours. Cardiac Enzymes: No results for input(s): "CKTOTAL", "CKMB", "CKMBINDEX", "TROPONINI" in the last 168 hours. BNP (last 3 results) No results for input(s): "PROBNP" in the last 8760 hours. HbA1C: No results for input(s): "HGBA1C" in the last 72 hours. CBG: No results for input(s): "GLUCAP" in the last 168 hours. Lipid Profile: No results for input(s): "CHOL", "HDL", "LDLCALC", "TRIG", "CHOLHDL", "LDLDIRECT" in the last 72 hours. Thyroid Function Tests: No results for input(s): "TSH", "T4TOTAL", "FREET4", "T3FREE", "THYROIDAB" in the last 72 hours. Anemia Panel: No results for input(s): "VITAMINB12", "FOLATE", "FERRITIN", "TIBC", "IRON", "RETICCTPCT" in the last 72 hours. Urine analysis:    Component Value Date/Time   COLORURINE YELLOW 11/25/2023 0554   APPEARANCEUR CLOUDY (A) 11/25/2023 0554   LABSPEC >=1.030 11/25/2023 0554   PHURINE 6.0 11/25/2023 0554   GLUCOSEU NEGATIVE 11/25/2023 0554   HGBUR NEGATIVE 11/25/2023 0554   BILIRUBINUR NEGATIVE 11/25/2023 0554   BILIRUBINUR neg 03/08/2018 1526   KETONESUR 40 (A) 11/25/2023 0554   PROTEINUR NEGATIVE 11/25/2023 0554   UROBILINOGEN 0.2 03/08/2018 1526   UROBILINOGEN 1.0 06/23/2007 0434   NITRITE POSITIVE (A) 11/25/2023 0554   LEUKOCYTESUR TRACE (A) 11/25/2023 0554    Radiological Exams on Admission: CT ABDOMEN PELVIS W CONTRAST Result Date: 11/25/2023 CLINICAL DATA:  Abdominal pain radiating into the back, with nausea. Onset yesterday. EXAM: CT ABDOMEN AND PELVIS WITH CONTRAST TECHNIQUE: Multidetector CT imaging of the abdomen and pelvis was performed using the standard protocol following bolus administration of intravenous contrast. RADIATION DOSE REDUCTION: This exam was performed according to the departmental dose-optimization program which includes automated exposure control, adjustment of the mA and/or kV  according to patient size and/or use of iterative reconstruction technique. CONTRAST:  OMNIPAQUE  IOHEXOL  300 MG/ML  SOLN COMPARISON:  CTs with IV contrast 08/15/2022 and 03/04/2021. FINDINGS: Lower chest: No abnormality. Hepatobiliary: No abnormality. Pancreas: No abnormality. Spleen: Slightly prominent, 13.1 cm AP.  No mass.  Unchanged. Adrenals/Urinary Tract: No abnormality. Stomach/Bowel: The stomach is slightly distended with food products, with a normal wall thickness. The unopacified small bowel is normal caliber except in the left hemiabdomen and upper pelvis where segments are slightly dilated to maximum caliber 2.7 cm. The more distal small bowel is collapsed and this is suspected due to intermediate grade obstruction of the small bowel. There is an abrupt focal transition from dilated to decompressed caliber, in the left lower abdominopelvic quadrant which is best seen on coronal reconstruction series 7, image 59, and axial series 2 images 60 and 61. Etiology could be adhesions or small-bowel kinking as there is no visible internal hernia. There is no mesenteric congestion or bowel pneumatosis at this level or elsewhere. This appears to  be somewhere in the mid ileum. An appendix is not seen. There is mild fecal stasis. The large bowel wall unremarkable apart from uncomplicated sigmoid diverticula. Vascular/Lymphatic: No significant vascular findings are present. No enlarged abdominal or pelvic lymph nodes. Reproductive: Intact uterus.  Unremarkable left adnexal structures. There is a 3.1 cm thin walled low-density lesion of the right ovary, Hounsfield density is 31 and is probably a hemorrhagic cyst. This has been seen previously or at least a similar finding was present previously, was larger in 2022 measuring 3.5 cm. Ultrasound follow-up is suggested if there are localizing symptoms. Other: Trace low-density free fluid in the pelvic cul-de-sac, likely physiologic given age. No free hemorrhage, free  air, or focal inflammatory process. There are no incarcerated hernias. Musculoskeletal: No acute or significant osseous findings. IMPRESSION: 1. Findings are consistent with intermediate grade small-bowel obstruction with abrupt focal transition in the left lower abdominopelvic quadrant, probably in the mid ileum. Etiology could be adhesions or small-bowel kinking as there is no visible internal hernia. 2. Constipation and diverticulosis. 3. 3.1 cm thin walled low-density lesion of the right ovary, probably a hemorrhagic cyst. This has been seen previously or at least a similar finding was present on both prior studies, was larger in 2022 measuring 3.5 cm. Ultrasound follow-up is suggested if there are localizing symptoms. 4. Trace low-density free fluid in the pelvic cul-de-sac, likely physiologic given age. 5. Slightly prominent spleen, unchanged. Electronically Signed   By: Denman Fischer M.D.   On: 11/25/2023 06:23     Assessment/Plan Principal Problem:   LLQ abdominal pain   # Left-sided abdominal pain # SBO One day left sided abdominal pain, CT showing bowel obstruction in that vicinity. No complications or other acute intra-abdominal pathology. Labs are benign. Pain improved with pain meds. No vomiting but is nauseous. Not pregnant.  - NPO - gen surg to see - start fluids - pain control, anti-emetic  # Asymptomatic bacteriuria May be related to bladder prolapse. Received ceftriaxone  in the ER. - as patient is asymptomatic will elect to hold on further treatment - monitor symptoms and f/u culture  # Obesity Noted  # Peripheral neuropathy - home gabapentin   DVT prophylaxis: lovenox Code Status: full  Family Communication: none at bedside  Consults called: gen surg   Level of care: Med-Surg Status is: Observation The patient remains OBS appropriate and will d/c before 2 midnights.    Raymonde Calico MD [facility] Pager [phone]  If 7PM-7AM, please contact  night-coverage [email or web address] Password [email or web address]  11/25/2023, 10:41 AM

## 2023-11-25 NOTE — ED Provider Notes (Signed)
 [facility]-EMERGENCY DEPT [facility] Emergency Department Provider Note MRN:  [id number]  Arrival date & time: 11/25/23     Chief Complaint   Abdominal Pain   History of Present Illness   Christina Kennedy is a 28 y.o. year-old female with no pertinent past medical history presenting to the ED with chief complaint of nominal pain.  Left lower quadrant abdominal pain with radiation to the left flank moderate to severe over the past couple days, not going away.  No fever, no associated nausea vomiting or diarrhea, no dysuria or hematuria.  Review of Systems  A thorough review of systems was obtained and all systems are negative except as noted in the HPI and PMH.   Patient's Health History    Past Medical History:  Diagnosis Date   Allergic rhinitis    seasonal   Anxiety    Chlamydia    Headache(784.0)    Infection    UTI   Urethral diverticulum     Past Surgical History:  Procedure Laterality Date   FRACTURE SURGERY     TONSILLECTOMY AND ADENOIDECTOMY  2001   TYMPANOSTOMY TUBE PLACEMENT  2001    Family History  Problem Relation Age of Onset   Migraines Mother    Migraines Maternal Grandmother    Cancer Maternal Grandmother        throat   Diabetes Paternal Grandfather    Cancer Paternal Grandfather        blood cancer    Social History   Socioeconomic History   Marital status: Single    Spouse name: Not on file   Number of children: Not on file   Years of education: Not on file   Highest education level: Not on file  Occupational History   Not on file  Tobacco Use   Smoking status: Every Day    Types: E-cigarettes, Cigarettes   Smokeless tobacco: Never   Tobacco comments:    June 2018  Vaping Use   Vaping status: Every Day   Substances: Nicotine  Substance and Sexual Activity   Alcohol use: Yes    Alcohol/week: 0.0 standard drinks of alcohol    Comment: rare   Drug use: No   Sexual activity: Yes    Partners: Male    Birth control/protection:  Pill  Other Topics Concern   Not on file  Social History Narrative   Not on file   Social Drivers of Health   Financial Resource Strain: Low Risk  (02/28/2022)   Received from [facility], [facility] visits prior to 08/29/2022.   Overall Financial Resource Strain (CARDIA)    Difficulty of Paying Living Expenses: Not very hard  Food Insecurity: Low Risk  (09/09/2022)   Received from [facility]   Hunger Vital Sign    Worried About Running Out of Food in the Last Year: Never true    Ran Out of Food in the Last Year: Never true  Transportation Needs: Not on file (09/09/2022)  Physical Activity: Inactive (02/28/2022)   Received from [facility], [facility] visits prior to 08/29/2022.   Exercise Vital Sign    Days of Exercise per Week: 0 days    Minutes of Exercise per Session: 0 min  Stress: Stress Concern Present (02/28/2022)   Received from [facility], [facility] visits prior to 08/29/2022.   [employer] of Occupational Health - Occupational Stress Questionnaire    Feeling of Stress : To some extent  Social Connections: Socially Isolated (02/28/2022)   Received from [facility], [facility] visits prior to 08/29/2022.   Social Advertising account executive [NHANES]    Frequency of Communication with Friends and Family: Twice a week    Frequency of Social Gatherings with Friends and Family: Twice a week    Attends Religious Services: Never    Database administrator or Organizations: No    Attends Banker Meetings: Never    Marital Status: Never married  Intimate Partner Violence: Not At Risk (02/28/2022)   Received from [facility] visits prior to 08/29/2022.   Humiliation, Afraid, Rape, and Kick questionnaire    Fear of Current or Ex-Partner: No    Emotionally Abused: No    Physically Abused: No    Sexually Abused: No     Physical Exam   Vitals:    11/25/23 0412  BP: 127/87  Pulse: (!) 104  Resp: 20  Temp: (!) 97.3 F (36.3 C)  SpO2: 100%    CONSTITUTIONAL: Well-appearing, NAD NEURO/PSYCH:  Alert and oriented x 3, no focal deficits EYES:  eyes equal and reactive ENT/NECK:  no LAD, no JVD CARDIO: Regular rate, well-perfused, normal S1 and S2 PULM:  CTAB no wheezing or rhonchi GI/GU:  non-distended, non-tender MSK/SPINE:  No gross deformities, no edema SKIN:  no rash, atraumatic   *Additional and/or pertinent findings included in MDM below  Diagnostic and Interventional Summary    EKG Interpretation Date/Time:    Ventricular Rate:    PR Interval:    QRS Duration:    QT Interval:    QTC Calculation:   R Axis:      Text Interpretation:         Labs Reviewed  COMPREHENSIVE METABOLIC PANEL WITH GFR - Abnormal; Notable for the following components:      Result Value   Potassium 3.4 (*)    All other components within normal limits  URINALYSIS, ROUTINE W REFLEX MICROSCOPIC - Abnormal; Notable for the following components:   APPearance CLOUDY (*)    Ketones, ur 40 (*)    Nitrite POSITIVE (*)    Leukocytes,Ua TRACE (*)    All other components within normal limits  URINALYSIS, MICROSCOPIC (REFLEX) - Abnormal; Notable for the following components:   Bacteria, UA MANY (*)    All other components within normal limits  CBC  LIPASE, BLOOD  HCG, SERUM, QUALITATIVE    CT ABDOMEN PELVIS W CONTRAST  Final Result      Medications  cefTRIAXone  (ROCEPHIN ) 2 g in sodium chloride  0.9 % 100 mL IVPB (2 g Intravenous New Bag/Given 11/25/23 0634)  morphine  (PF) 4 MG/ML injection 4 mg (4 mg Intravenous Given 11/25/23 0451)  ondansetron  (ZOFRAN ) injection 4 mg (4 mg Intravenous Given 11/25/23 0450)  sodium chloride  0.9 % bolus 1,000 mL (0 mLs Intravenous Stopped 11/25/23 0549)  iohexol  (OMNIPAQUE ) 300 MG/ML solution 100 mL (100 mLs Intravenous Contrast Given 11/25/23 0554)     Procedures  /  Critical Care Procedures  ED Course  and Medical Decision Making  Initial Impression and Ddx Differential diagnosis includes pyelonephritis, kidney stone, ovarian cyst, diverticulitis.  Past medical/surgical history that increases complexity of ED encounter: None  Interpretation of Diagnostics I personally reviewed the Laboratory Testing and my interpretation is as follows: No significant blood count or electrolyte disturbance.  Urinalysis with evidence to suggest infection.  CT revealing evidence of small bowel obstruction with transition point.  Patient Reassessment and  Ultimate Disposition/Management     CT scan is a bit of a surprise, not really correlating with the history.  Patient claims normal bowel movements recently, does not have a history of abdominal surgeries.  Pain is improved.  The CT scan findings are not subtle, will talk to general surgery and likely hospital service about admission.  Providing antibiotics for the likely UTI or possible pyelonephritis given the left flank pain.  Patient management required discussion with the following services or consulting groups:  Hospitalist Service and General/Trauma Surgery  Complexity of Problems Addressed Acute illness or injury that poses threat of life of bodily function  Additional Data Reviewed and Analyzed Further history obtained from: None  Additional Factors Impacting ED Encounter Risk Consideration of hospitalization  Merrick Abe. Harless Lien, MD [facility] Emergency Medicine [facility] [email or web address]  Final Clinical Impressions(s) / ED Diagnoses     ICD-10-CM   1. SBO (small bowel obstruction) (HCC)  K56.609     2. Acute cystitis without hematuria  N30.00       ED Discharge Orders     None        Discharge Instructions Discussed with and Provided to Patient:   Discharge Instructions   None      Edson Graces, MD 11/25/23 [phone]

## 2023-11-25 NOTE — ED Notes (Signed)
 [facility] lab unable to see urine culture order added by hospitalist. New order placed for urine culture by EDP

## 2023-11-25 NOTE — ED Notes (Signed)
 Attempted to call report to inpatient unit. Receiving RN stated she would call back

## 2023-11-25 NOTE — Progress Notes (Signed)
   11/25/23 1559  TOC Brief Assessment  Insurance and Status Reviewed  Patient has primary care physician No (No Pcp Per)  Home environment has been reviewed From home apartment  Prior level of function: Independent  Prior/Current Home Services No current home services  Social Drivers of Health Review SDOH reviewed no interventions necessary  Readmission risk has been reviewed Yes  Transition of care needs no transition of care needs at this time

## 2023-11-25 NOTE — Consult Note (Signed)
 Christina Kennedy September 12, 1995  [id number].    Requesting MD: Albertus Hughs, DO  Chief Complaint/Reason for Consult: LLQ abdominal pain with possible pSBO   HPI: Christina Kennedy is a 28 y.o. female with past medical history which includes UTI and Urethral diverticulum who presents to the [facility] ED with chief complaint of abdomina pain concentrated in the left lower quadrant with radiation to the left flank moderate to severe over the past couple days and persistent. Patient  denies fever, associated nausea,vomiting and diarrhea,dysuria or hematuria, denies hx of STI or ever been told she had a dx of PID.   Past Medical History: Anxiety, UTI, Urethral diverticulum, Headache Prior Abdominal Surgeries: None Blood Thinners: None Last PO intake: 5/28 Last Colonoscopy: None Allergies: None Tobacco Use: Yes, 5 Cigarettes daily Alcohol Use: Some.  Substance use: None    ROS: Review of Systems  Constitutional:  Negative for chills, fever and weight loss.  Eyes:  Negative for blurred vision.  Cardiovascular:  Negative for chest pain, palpitations and leg swelling.  Gastrointestinal:  Positive for abdominal pain. Negative for blood in stool, constipation, diarrhea, heartburn, melena, nausea and vomiting.  Genitourinary:  Positive for flank pain. Negative for dysuria, frequency and urgency.    Family History  Problem Relation Age of Onset   Migraines Mother    Migraines Maternal Grandmother    Cancer Maternal Grandmother        throat   Diabetes Paternal Grandfather    Cancer Paternal Grandfather        blood cancer    Past Medical History:  Diagnosis Date   Allergic rhinitis    seasonal   Anxiety    Chlamydia    Headache(784.0)    Infection    UTI   Urethral diverticulum     Past Surgical History:  Procedure Laterality Date   FRACTURE SURGERY     TONSILLECTOMY AND ADENOIDECTOMY  2001   TYMPANOSTOMY TUBE PLACEMENT  2001    Social History:  reports that she has been smoking  e-cigarettes and cigarettes. She has never used smokeless tobacco. She reports current alcohol use. She reports that she does not use drugs.  Allergies: No Known Allergies  Medications Prior to Admission  Medication Sig Dispense Refill   amitriptyline (ELAVIL) 10 MG tablet Take 1 tablet by mouth at bedtime.     SUMAtriptan  (IMITREX ) 50 MG tablet Take 50 mg by mouth.     cefadroxil  (DURICEF) 500 MG capsule Take 1 capsule (500 mg total) by mouth 2 (two) times daily. 14 capsule 0   gabapentin  (NEURONTIN ) 100 MG capsule Take 1 capsule (100 mg total) by mouth 3 (three) times daily as needed. 90 capsule 0   lidocaine  (LIDODERM ) 5 % Place 1 patch onto the skin daily. Remove & Discard patch within 12 hours or as directed by MD 30 patch 0   ondansetron  (ZOFRAN ) 4 MG tablet Take 1 tablet (4 mg total) by mouth every 8 (eight) hours as needed for nausea or vomiting. 12 tablet 0     Physical Exam: Blood pressure 114/69, pulse 87, temperature 98.5 F (36.9 C), temperature source Oral, resp. rate 17, height 5\' 1"  (1.549 m), weight 79.8 kg, last menstrual period 11/22/2023, SpO2 100%, unknown if currently breastfeeding. Physical Exam Constitutional:      General: She is not in acute distress.    Appearance: She is not diaphoretic.  Abdominal:     General: Abdomen is flat. There is no distension.  Palpations: Abdomen is soft.     Tenderness: There is abdominal tenderness in the left lower quadrant. There is no guarding or rebound. Negative signs include Murphy's sign, Rovsing's sign and McBurney's sign.  Skin:    General: Skin is warm and dry.  Neurological:     General: No focal deficit present.  Psychiatric:        Mood and Affect: Mood is not anxious.      Results for orders placed or performed during the hospital encounter of 11/25/23 (from the past 48 hours)  hCG, serum, qualitative     Status: None   Collection Time: 11/25/23  4:43 AM  Result Value Ref Range   Preg, Serum NEGATIVE  NEGATIVE    Comment:        THE SENSITIVITY OF THIS METHODOLOGY IS >10 mIU/mL. Performed at [facility], [address] [place], [place] [address]   CBC     Status: None   Collection Time: 11/25/23  4:46 AM  Result Value Ref Range   WBC 6.1 4.0 - 10.5 K/uL   RBC 4.82 3.87 - 5.11 MIL/uL   Hemoglobin 14.3 12.0 - 15.0 g/dL   HCT 69.6 29.5 - 28.4 %   MCV 84.9 80.0 - 100.0 fL   MCH 29.7 26.0 - 34.0 pg   MCHC 35.0 30.0 - 36.0 g/dL   RDW 13.2 44.0 - 10.2 %   Platelets 243 150 - 400 K/uL   nRBC 0.0 0.0 - 0.2 %    Comment: Performed at [facility], [address] [place], [place] [address]  Comprehensive metabolic panel with GFR     Status: Abnormal   Collection Time: 11/25/23  4:46 AM  Result Value Ref Range   Sodium 139 135 - 145 mmol/L   Potassium 3.4 (L) 3.5 - 5.1 mmol/L   Chloride 106 98 - 111 mmol/L   CO2 22 22 - 32 mmol/L   Glucose, Bld 83 70 - 99 mg/dL    Comment: Glucose reference range applies only to samples taken after fasting for at least 8 hours.   BUN 13 6 - 20 mg/dL   Creatinine, Ser 6.44 0.44 - 1.00 mg/dL   Calcium 9.7 8.9 - 03.4 mg/dL   Total Protein 8.1 6.5 - 8.1 g/dL   Albumin 4.9 3.5 - 5.0 g/dL   AST 18 15 - 41 U/L   ALT 11 0 - 44 U/L   Alkaline Phosphatase 80 38 - 126 U/L   Total Bilirubin 0.5 0.0 - 1.2 mg/dL   GFR, Estimated >74 >25 mL/min    Comment: (NOTE) Calculated using the CKD-EPI Creatinine Equation (2021)    Anion gap 12 5 - 15    Comment: Performed at [facility], [address] [place], [place] [address]  Lipase, blood     Status: None   Collection Time: 11/25/23  4:46 AM  Result Value Ref Range   Lipase 39 11 - 51 U/L    Comment: Performed at [facility], [address] [place], [place] [address]  Urinalysis, Routine w reflex microscopic -Urine, Clean Catch     Status: Abnormal   Collection Time: 11/25/23  5:54 AM  Result Value Ref Range   Color, Urine YELLOW YELLOW   APPearance CLOUDY  (A) CLEAR   Specific Gravity, Urine >=1.030 1.005 - 1.030   pH 6.0 5.0 - 8.0   Glucose, UA NEGATIVE NEGATIVE mg/dL   Hgb  urine dipstick NEGATIVE NEGATIVE   Bilirubin Urine NEGATIVE NEGATIVE   Ketones, ur 40 (A) NEGATIVE mg/dL   Protein, ur NEGATIVE NEGATIVE mg/dL   Nitrite POSITIVE (A) NEGATIVE   Leukocytes,Ua TRACE (A) NEGATIVE    Comment: Performed at [facility], [address] [place], [place] [address]  Urinalysis, Microscopic (reflex)     Status: Abnormal   Collection Time: 11/25/23  5:54 AM  Result Value Ref Range   RBC / HPF 0-5 0 - 5 RBC/hpf   WBC, UA 11-20 0 - 5 WBC/hpf   Bacteria, UA MANY (A) NONE SEEN   Squamous Epithelial / HPF 0-5 0 - 5 /HPF   Mucus PRESENT     Comment: Performed at [facility], [address] [place], [place] [address]   CT ABDOMEN PELVIS W CONTRAST Result Date: 11/25/2023 CLINICAL DATA:  Abdominal pain radiating into the back, with nausea. Onset yesterday. EXAM: CT ABDOMEN AND PELVIS WITH CONTRAST TECHNIQUE: Multidetector CT imaging of the abdomen and pelvis was performed using the standard protocol following bolus administration of intravenous contrast. RADIATION DOSE REDUCTION: This exam was performed according to the departmental dose-optimization program which includes automated exposure control, adjustment of the mA and/or kV according to patient size and/or use of iterative reconstruction technique. CONTRAST:  OMNIPAQUE  IOHEXOL  300 MG/ML  SOLN COMPARISON:  CTs with IV contrast 08/15/2022 and 03/04/2021. FINDINGS: Lower chest: No abnormality. Hepatobiliary: No abnormality. Pancreas: No abnormality. Spleen: Slightly prominent, 13.1 cm AP.  No mass.  Unchanged. Adrenals/Urinary Tract: No abnormality. Stomach/Bowel: The stomach is slightly distended with food products, with a normal wall thickness. The unopacified small bowel is normal caliber except in the left hemiabdomen and upper pelvis where segments are slightly dilated to  maximum caliber 2.7 cm. The more distal small bowel is collapsed and this is suspected due to intermediate grade obstruction of the small bowel. There is an abrupt focal transition from dilated to decompressed caliber, in the left lower abdominopelvic quadrant which is best seen on coronal reconstruction series 7, image 59, and axial series 2 images 60 and 61. Etiology could be adhesions or small-bowel kinking as there is no visible internal hernia. There is no mesenteric congestion or bowel pneumatosis at this level or elsewhere. This appears to be somewhere in the mid ileum. An appendix is not seen. There is mild fecal stasis. The large bowel wall unremarkable apart from uncomplicated sigmoid diverticula. Vascular/Lymphatic: No significant vascular findings are present. No enlarged abdominal or pelvic lymph nodes. Reproductive: Intact uterus.  Unremarkable left adnexal structures. There is a 3.1 cm thin walled low-density lesion of the right ovary, Hounsfield density is 31 and is probably a hemorrhagic cyst. This has been seen previously or at least a similar finding was present previously, was larger in 2022 measuring 3.5 cm. Ultrasound follow-up is suggested if there are localizing symptoms. Other: Trace low-density free fluid in the pelvic cul-de-sac, likely physiologic given age. No free hemorrhage, free air, or focal inflammatory process. There are no incarcerated hernias. Musculoskeletal: No acute or significant osseous findings. IMPRESSION: 1. Findings are consistent with intermediate grade small-bowel obstruction with abrupt focal transition in the left lower abdominopelvic quadrant, probably in the mid ileum. Etiology could be adhesions or small-bowel kinking as there is no visible internal hernia. 2. Constipation and diverticulosis. 3. 3.1 cm thin walled low-density lesion of the right ovary, probably a hemorrhagic cyst. This has been seen previously or at least a similar finding was present  on both  prior studies, was larger in 2022 measuring 3.5 cm. Ultrasound follow-up is suggested if there are localizing symptoms. 4. Trace low-density free fluid in the pelvic cul-de-sac, likely physiologic given age. 5. Slightly prominent spleen, unchanged. Electronically Signed   By: Denman Fischer M.D.   On: 11/25/2023 06:23    Anti-infectives (From admission, onward)    Start     Dose/Rate Route Frequency Ordered Stop   11/25/23 0615  cefTRIAXone  (ROCEPHIN ) 2 g in sodium chloride  0.9 % 100 mL IVPB        2 g 200 mL/hr over 30 Minutes Intravenous  Once 11/25/23 0612 11/25/23 0708       Assessment/Plan  Possible pSBO  Mahi Zabriskie is a 28 y.o. female with past medical history which includes UTI and Urethral diverticulum who presents to the [facility] ED with chief complaint of abdomina pain concentrated in the left lower quadrant with radiation to the left flank moderate to severe over the past couple days and persistent. CT shows mildly dilated loops of small bowel.  Patient to be given Zofran  to help with nausea, will hold on placing NG tube. SBO protocol initiated to evaluate potential of obstruction. Pain management through morphine and tylenol  prn. No indications for emergent surgical intervention.      FEN - NPO,   VTE - Lovenox ID - On rocephin  for UTI Foley - None  Dispo - Admit to [facility] hospital for observation.   I reviewed ED provider notes, last 24 h vitals and pain scores, last 48 h intake and output, last 24 h labs and trends, and last 24 h imaging results.  This care required high  level of medical decision making.   Mirta Ammon, PA-C [facility] Surgery 11/25/2023, 11:12 AM Please see Amion for pager number during day hours 7:00am-4:30pm

## 2023-11-26 ENCOUNTER — Inpatient Hospital Stay (HOSPITAL_COMMUNITY)

## 2023-11-26 DIAGNOSIS — E876 Hypokalemia: Secondary | ICD-10-CM

## 2023-11-26 DIAGNOSIS — K56609 Unspecified intestinal obstruction, unspecified as to partial versus complete obstruction: Secondary | ICD-10-CM

## 2023-11-26 DIAGNOSIS — R1032 Left lower quadrant pain: Secondary | ICD-10-CM | POA: Diagnosis not present

## 2023-11-26 LAB — CBC
HCT: 35.6 % — ABNORMAL LOW (ref 36.0–46.0)
Hemoglobin: 11.7 g/dL — ABNORMAL LOW (ref 12.0–15.0)
MCH: 29.6 pg (ref 26.0–34.0)
MCHC: 32.9 g/dL (ref 30.0–36.0)
MCV: 90.1 fL (ref 80.0–100.0)
Platelets: 197 10*3/uL (ref 150–400)
RBC: 3.95 MIL/uL (ref 3.87–5.11)
RDW: 13.5 % (ref 11.5–15.5)
WBC: 3.9 10*3/uL — ABNORMAL LOW (ref 4.0–10.5)
nRBC: 0 % (ref 0.0–0.2)

## 2023-11-26 LAB — BASIC METABOLIC PANEL WITH GFR
Anion gap: 6 (ref 5–15)
BUN: 9 mg/dL (ref 6–20)
CO2: 24 mmol/L (ref 22–32)
Calcium: 8.3 mg/dL — ABNORMAL LOW (ref 8.9–10.3)
Chloride: 108 mmol/L (ref 98–111)
Creatinine, Ser: 0.67 mg/dL (ref 0.44–1.00)
GFR, Estimated: >60 mL/min (ref >60)
Glucose, Bld: 86 mg/dL (ref 70–99)
Potassium: 3.6 mmol/L (ref 3.5–5.1)
Sodium: 138 mmol/L (ref 135–145)

## 2023-11-26 MED ORDER — AMITRIPTYLINE HCL 10 MG PO TABS
10.0000 mg | ORAL_TABLET | Freq: Every day | ORAL | Status: DC
  Administered 2023-11-26 – 2023-11-28 (×3): 10 mg via ORAL
  Filled 2023-11-26 (×3): qty 1

## 2023-11-26 MED ORDER — SUMATRIPTAN SUCCINATE 50 MG PO TABS
50.0000 mg | ORAL_TABLET | ORAL | Status: DC | PRN
  Administered 2023-11-27 (×3): 50 mg via ORAL
  Filled 2023-11-26 (×4): qty 1

## 2023-11-26 MED ORDER — KCL IN DEXTROSE-NACL 20-5-0.45 MEQ/L-%-% IV SOLN
INTRAVENOUS | Status: DC
  Filled 2023-11-26 (×2): qty 1000

## 2023-11-26 NOTE — Plan of Care (Signed)
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
## 2023-11-26 NOTE — Progress Notes (Addendum)
 [facility] Progress Note     Subjective: CC:  Was doing better yesterday with less pain and less nausea, had two BMs. Today when she ate grits she developed recurrent LLQ pain and nausea 20-30 minutes after eating. Denies vomiting.   Objective: Vital signs in last 24 hours: Temp:  [97.8 F (36.6 C)-98.4 F (36.9 C)] 97.8 F (36.6 C) (05/30 0425) Pulse Rate:  [76-81] 81 (05/30 0425) Resp:  [16-18] 16 (05/30 0425) BP: (103-113)/(67-74) 103/72 (05/30 0425) SpO2:  [100 %] 100 % (05/30 0425) Last BM Date : 11/23/23  Intake/Output from previous day: 05/29 0701 - 05/30 0700 In: 445.5 [I.V.:345.2; IV Piggyback:100.3] Out: -  Intake/Output this shift: No intake/output data recorded.  PE: Gen:  Alert, NAD, cooperative, sitting up in bed Card:  Regular rate and rhythm Pulm:  Normal effort Abd: Soft, nondistended, focally tender in the LLQ without guarding or rebound tenderness, no hernias or masses Skin: warm and dry, no rashes  Psych: A&Ox3   Lab Results:  Recent Labs    11/25/23 0446 11/26/23 0529  WBC 6.1 3.9*  HGB 14.3 11.7*  HCT 40.9 35.6*  PLT 243 197   BMET Recent Labs    11/25/23 0446 11/26/23 0529  NA 139 138  K 3.4* 3.6  CL 106 108  CO2 22 24  GLUCOSE 83 86  BUN 13 9  CREATININE 0.72 0.67  CALCIUM 9.7 8.3*   PT/INR No results for input(s): "LABPROT", "INR" in the last 72 hours. CMP     Component Value Date/Time   NA 138 11/26/2023 0529   K 3.6 11/26/2023 0529   CL 108 11/26/2023 0529   CO2 24 11/26/2023 0529   GLUCOSE 86 11/26/2023 0529   BUN 9 11/26/2023 0529   CREATININE 0.67 11/26/2023 0529   CALCIUM 8.3 (L) 11/26/2023 0529   PROT 8.1 11/25/2023 0446   ALBUMIN 4.9 11/25/2023 0446   AST 18 11/25/2023 0446   ALT 11 11/25/2023 0446   ALKPHOS 80 11/25/2023 0446   BILITOT 0.5 11/25/2023 0446   GFRNONAA >60 11/26/2023 0529   GFRAA >60 02/20/2017 2157   Lipase     Component Value Date/Time   LIPASE 39 11/25/2023 0446        Studies/Results: DG Abd Portable 1V-Small Bowel Obstruction Protocol-initial, 8 hr delay Result Date: 11/25/2023 CLINICAL DATA:  Small-bowel protocol, 8 hours postcontrast EXAM: PORTABLE ABDOMEN - 1 VIEW COMPARISON:  CT abdomen pelvis 11/25/2023 FINDINGS: Enteric contrast is present throughout the small bowel and colon. No bowel dilation. IMPRESSION: No evidence of obstruction. Electronically Signed   By: Rozell Cornet M.D.   On: 11/25/2023 23:18   CT ABDOMEN PELVIS W CONTRAST Result Date: 11/25/2023 CLINICAL DATA:  Abdominal pain radiating into the back, with nausea. Onset yesterday. EXAM: CT ABDOMEN AND PELVIS WITH CONTRAST TECHNIQUE: Multidetector CT imaging of the abdomen and pelvis was performed using the standard protocol following bolus administration of intravenous contrast. RADIATION DOSE REDUCTION: This exam was performed according to the departmental dose-optimization program which includes automated exposure control, adjustment of the mA and/or kV according to patient size and/or use of iterative reconstruction technique. CONTRAST:  OMNIPAQUE  IOHEXOL  300 MG/ML  SOLN COMPARISON:  CTs with IV contrast 08/15/2022 and 03/04/2021. FINDINGS: Lower chest: No abnormality. Hepatobiliary: No abnormality. Pancreas: No abnormality. Spleen: Slightly prominent, 13.1 cm AP.  No mass.  Unchanged. Adrenals/Urinary Tract: No abnormality. Stomach/Bowel: The stomach is slightly distended with food products, with a normal wall thickness. The unopacified small bowel  is normal caliber except in the left hemiabdomen and upper pelvis where segments are slightly dilated to maximum caliber 2.7 cm. The more distal small bowel is collapsed and this is suspected due to intermediate grade obstruction of the small bowel. There is an abrupt focal transition from dilated to decompressed caliber, in the left lower abdominopelvic quadrant which is best seen on coronal reconstruction series 7, image 59, and axial  series 2 images 60 and 61. Etiology could be adhesions or small-bowel kinking as there is no visible internal hernia. There is no mesenteric congestion or bowel pneumatosis at this level or elsewhere. This appears to be somewhere in the mid ileum. An appendix is not seen. There is mild fecal stasis. The large bowel wall unremarkable apart from uncomplicated sigmoid diverticula. Vascular/Lymphatic: No significant vascular findings are present. No enlarged abdominal or pelvic lymph nodes. Reproductive: Intact uterus.  Unremarkable left adnexal structures. There is a 3.1 cm thin walled low-density lesion of the right ovary, Hounsfield density is 31 and is probably a hemorrhagic cyst. This has been seen previously or at least a similar finding was present previously, was larger in 2022 measuring 3.5 cm. Ultrasound follow-up is suggested if there are localizing symptoms. Other: Trace low-density free fluid in the pelvic cul-de-sac, likely physiologic given age. No free hemorrhage, free air, or focal inflammatory process. There are no incarcerated hernias. Musculoskeletal: No acute or significant osseous findings. IMPRESSION: 1. Findings are consistent with intermediate grade small-bowel obstruction with abrupt focal transition in the left lower abdominopelvic quadrant, probably in the mid ileum. Etiology could be adhesions or small-bowel kinking as there is no visible internal hernia. 2. Constipation and diverticulosis. 3. 3.1 cm thin walled low-density lesion of the right ovary, probably a hemorrhagic cyst. This has been seen previously or at least a similar finding was present on both prior studies, was larger in 2022 measuring 3.5 cm. Ultrasound follow-up is suggested if there are localizing symptoms. 4. Trace low-density free fluid in the pelvic cul-de-sac, likely physiologic given age. 5. Slightly prominent spleen, unchanged. Electronically Signed   By: Denman Fischer M.D.   On: 11/25/2023 06:23     Anti-infectives: Anti-infectives (From admission, onward)    Start     Dose/Rate Route Frequency Ordered Stop   11/25/23 0615  cefTRIAXone  (ROCEPHIN ) 2 g in sodium chloride  0.9 % 100 mL IVPB        2 g 200 mL/hr over 30 Minutes Intravenous  Once 11/25/23 0612 11/25/23 0708        Assessment/Plan ?pSBO   28 y/o F with what she reports as her first episode of this LLQ pain assocaited with nausea. She has never had abdominal surgery. CT scan shows SBO with a focal small bowel transition point in the LLQ. She underwent SBO protocol and contrast progressed to the colon on xray, with a non-obstructive bowel gas pattern. She was clinically better, tolerating CLD, until she ate grits with recurrence of symptoms.   No emergent surgical needs. Will order a small bowel follow through study to further evaluate possible abnormal segment of LLQ small bowel.   Back off diet to clears.    LOS: 1 day   I reviewed nursing notes, hospitalist notes, last 24 h vitals and pain scores, last 48 h intake and output, last 24 h labs and trends, and last 24 h imaging results.  This care required moderate level of medical decision making.   Michial Akin, PA-C [facility] Please see Amion for pager  number during day hours 7:00am-4:30pm

## 2023-11-27 ENCOUNTER — Inpatient Hospital Stay (HOSPITAL_COMMUNITY)

## 2023-11-27 DIAGNOSIS — E876 Hypokalemia: Secondary | ICD-10-CM | POA: Diagnosis not present

## 2023-11-27 DIAGNOSIS — R1032 Left lower quadrant pain: Secondary | ICD-10-CM | POA: Diagnosis not present

## 2023-11-27 DIAGNOSIS — K56609 Unspecified intestinal obstruction, unspecified as to partial versus complete obstruction: Secondary | ICD-10-CM | POA: Diagnosis not present

## 2023-11-27 LAB — CBC
HCT: 40.5 % (ref 36.0–46.0)
Hemoglobin: 13.4 g/dL (ref 12.0–15.0)
MCH: 29.6 pg (ref 26.0–34.0)
MCHC: 33.1 g/dL (ref 30.0–36.0)
MCV: 89.4 fL (ref 80.0–100.0)
Platelets: 204 10*3/uL (ref 150–400)
RBC: 4.53 MIL/uL (ref 3.87–5.11)
RDW: 13.2 % (ref 11.5–15.5)
WBC: 3.5 10*3/uL — ABNORMAL LOW (ref 4.0–10.5)
nRBC: 0 % (ref 0.0–0.2)

## 2023-11-27 LAB — URINE CULTURE: Culture: >=100000 — AB

## 2023-11-27 NOTE — Progress Notes (Signed)
   Subjective/Chief Complaint: Feels well this am having bowel function, tol clear   Objective: Vital signs in last 24 hours: Temp:  [97.9 F (36.6 C)-98.6 F (37 C)] 97.9 F (36.6 C) (05/31 0516) Pulse Rate:  [67-71] 69 (05/31 0516) Resp:  [14-17] 17 (05/31 0516) BP: (102-111)/(61-72) 110/72 (05/31 0516) SpO2:  [98 %-100 %] 100 % (05/31 0516) Last BM Date : 11/26/23  Intake/Output from previous day: 05/30 0701 - 05/31 0700 In: 3627.2 [P.O.:120; I.V.:3507.2] Out: -  Intake/Output this shift: No intake/output data recorded.  Ab soft nontender  Lab Results:  Recent Labs    11/25/23 0446 11/26/23 0529  WBC 6.1 3.9*  HGB 14.3 11.7*  HCT 40.9 35.6*  PLT 243 197   BMET Recent Labs    11/25/23 0446 11/26/23 0529  NA 139 138  K 3.4* 3.6  CL 106 108  CO2 22 24  GLUCOSE 83 86  BUN 13 9  CREATININE 0.72 0.67  CALCIUM 9.7 8.3*   PT/INR No results for input(s): "LABPROT", "INR" in the last 72 hours. ABG No results for input(s): "PHART", "HCO3" in the last 72 hours.  Invalid input(s): "PCO2", "PO2"  Studies/Results: DG Abd Portable 1V Result Date: 11/26/2023 CLINICAL DATA:  Partial small bowel obstruction. EXAM: PORTABLE ABDOMEN - 1 VIEW COMPARISON:  Abdominal x-ray 11/25/2023 FINDINGS: Oral contrast is now seen throughout nondilated colon. No dilated loops of bowel are identified peer IMPRESSION: Nonobstructive bowel gas pattern. Electronically Signed   By: Tyron Gallon M.D.   On: 11/26/2023 22:28   DG Fluoro Rm 1-60 Min - No Report Result Date: 11/26/2023 Fluoroscopy was utilized by the requesting physician.  No radiographic interpretation.   DG Abd Portable 1V-Small Bowel Obstruction Protocol-initial, 8 hr delay Result Date: 11/25/2023 CLINICAL DATA:  Small-bowel protocol, 8 hours postcontrast EXAM: PORTABLE ABDOMEN - 1 VIEW COMPARISON:  CT abdomen pelvis 11/25/2023 FINDINGS: Enteric contrast is present throughout the small bowel and colon. No bowel dilation.  IMPRESSION: No evidence of obstruction. Electronically Signed   By: Rozell Cornet M.D.   On: 11/25/2023 23:18    Anti-infectives: Anti-infectives (From admission, onward)    Start     Dose/Rate Route Frequency Ordered Stop   11/25/23 0615  cefTRIAXone  (ROCEPHIN ) 2 g in sodium chloride  0.9 % 100 mL IVPB        2 g 200 mL/hr over 30 Minutes Intravenous  Once 11/25/23 0612 11/25/23 0708       Assessment/Plan: ?pSBO  -she is  doing well clinically this am with no real recurrence of symptoms on clears -not sure of what caused this iniitally but does not need surgery at this point -I think reasonable to do soft diet today and if this goes well can dc home in am -if has recurrence then will plan on lsc in am  I reviewed hospitalist notes, last 24 h vitals and pain scores, last 24 h labs and trends, and last 24 h imaging results.   Christina Kennedy 11/27/2023

## 2023-11-27 NOTE — Anesthesia Preprocedure Evaluation (Signed)
 Anesthesia Evaluation    Airway        Dental   Pulmonary Current Smoker          Cardiovascular      Neuro/Psych  Headaches    GI/Hepatic Partial SBO   Endo/Other  [place] BMI 33  Renal/GU      Musculoskeletal   Abdominal   Peds  Hematology   Anesthesia Other Findings   Reproductive/Obstetrics                              Anesthesia Physical Anesthesia Plan  ASA: 3  Anesthesia Plan: General   Post-op Pain Management: Ofirmev  IV (intra-op)*   Induction: Intravenous and Rapid sequence  PONV Risk Score and Plan: 2 and Ondansetron  and Dexamethasone  Airway Management Planned: Oral ETT  Additional Equipment: None  Intra-op Plan:   Post-operative Plan: Extubation in OR  Informed Consent:   Plan Discussed with:   Anesthesia Plan Comments:          Anesthesia Quick Evaluation
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
## 2023-11-28 ENCOUNTER — Encounter (HOSPITAL_COMMUNITY)
Admission: EM | Disposition: A | Discharge status: Home / Self Care | Payer: Self-pay | Source: Home / Self Care | Attending: Obstetrics and Gynecology

## 2023-11-28 ENCOUNTER — Inpatient Hospital Stay (HOSPITAL_COMMUNITY)

## 2023-11-28 ENCOUNTER — Encounter (HOSPITAL_COMMUNITY): Payer: Self-pay | Admitting: Anesthesiology

## 2023-11-28 DIAGNOSIS — R1032 Left lower quadrant pain: Secondary | ICD-10-CM | POA: Diagnosis not present

## 2023-11-28 LAB — CBC
HCT: 40.1 % (ref 36.0–46.0)
Hemoglobin: 13.4 g/dL (ref 12.0–15.0)
MCH: 29.2 pg (ref 26.0–34.0)
MCHC: 33.4 g/dL (ref 30.0–36.0)
MCV: 87.4 fL (ref 80.0–100.0)
Platelets: 223 10*3/uL (ref 150–400)
RBC: 4.59 MIL/uL (ref 3.87–5.11)
RDW: 12.9 % (ref 11.5–15.5)
WBC: 3.9 10*3/uL — ABNORMAL LOW (ref 4.0–10.5)
nRBC: 0 % (ref 0.0–0.2)

## 2023-11-28 LAB — COMPREHENSIVE METABOLIC PANEL WITH GFR
ALT: 34 U/L (ref 0–44)
AST: 32 U/L (ref 15–41)
Albumin: 4 g/dL (ref 3.5–5.0)
Alkaline Phosphatase: 62 U/L (ref 38–126)
Anion gap: 7 (ref 5–15)
BUN: 7 mg/dL (ref 6–20)
CO2: 24 mmol/L (ref 22–32)
Calcium: 9.1 mg/dL (ref 8.9–10.3)
Chloride: 105 mmol/L (ref 98–111)
Creatinine, Ser: 0.7 mg/dL (ref 0.44–1.00)
GFR, Estimated: >60 mL/min (ref >60)
Glucose, Bld: 73 mg/dL (ref 70–99)
Potassium: 3.7 mmol/L (ref 3.5–5.1)
Sodium: 136 mmol/L (ref 135–145)
Total Bilirubin: 0.9 mg/dL (ref 0.0–1.2)
Total Protein: 7.2 g/dL (ref 6.5–8.1)

## 2023-11-28 SURGERY — LAPAROSCOPY, DIAGNOSTIC
Anesthesia: General

## 2023-11-28 MED ORDER — IOHEXOL 9 MG/ML PO SOLN
500.0000 mL | ORAL | Status: AC
  Administered 2023-11-28 (×2): 500 mL via ORAL

## 2023-11-28 NOTE — H&P (View-Only) (Signed)
 Referring Provider: Dr. Sherrod Dolphin Primary Care Physician:  Patient, No Pcp Per Primary Gastroenterologist:  Para Bold  Reason for Consultation:  Abdominal pain  HPI: Christina Kennedy is a 28 y.o. female with acute onset of abdominal pain on 5/29 described at that time as LLQ pain with nausea without vomiting. No change in BMs stating that she has continued to have formed stools except following the CT contrast when she had loose stools. Denies black or red stools and denies hematemesis. CT on 5/29 showed small bowel obstruction in the LLQ thought to be mid-ileum. Initial pain was unrelated to eating and was constant. Sharp pain is intermittent now but continues to feel a dull ache constantly. Points to left side of abdomen as location of pain initially but when she had postprandial pain with clears she points to left mid-quadrant and LUQ. Had increased pain with clear liquids this weekend. Thinks she had a similar episode a year ago that did not last long. On Zepbound for past 3 months. Social alcohol. Intermittent NSAIDs. Denies family history of ulcerative colitis or Crohn's disease. Has not had a colonoscopy or EGD. Mother at bedside.   Past Medical History:  Diagnosis Date   Allergic rhinitis    seasonal   Anxiety    Chlamydia    Headache(784.0)    Infection    UTI   Urethral diverticulum     Past Surgical History:  Procedure Laterality Date   FRACTURE SURGERY     TONSILLECTOMY AND ADENOIDECTOMY  2001   TYMPANOSTOMY TUBE PLACEMENT  2001    Prior to Admission medications   Medication Sig Start Date End Date Taking? Authorizing Provider  amitriptyline  (ELAVIL ) 10 MG tablet Take 10 mg by mouth at bedtime.   Yes [provider]  celecoxib (CELEBREX) 100 MG capsule Take 100 mg by mouth 2 (two) times daily as needed for mild pain (pain score 1-3) or moderate pain (pain score 4-6). 10/28/23  Yes [provider]  gabapentin  (NEURONTIN ) 300 MG capsule Take 300 mg by mouth 2  (two) times daily.   Yes [provider]  SUMAtriptan  (IMITREX ) 50 MG tablet Take 50 mg by mouth every 2 (two) hours as needed for migraine. May repeat in 2 hours if headache persists or recurs.   Yes [provider]  ZEPBOUND 5 MG/0.5ML Pen Inject 5 mg into the skin every Friday. 10/29/23  Yes [provider]    Scheduled Meds:  amitriptyline   10 mg Oral QHS   enoxaparin  (LOVENOX ) injection  40 mg Subcutaneous Q24H   gabapentin   100 mg Oral TID   Continuous Infusions: PRN Meds:.acetaminophen , morphine  injection, ondansetron  (ZOFRAN ) IV, SUMAtriptan   Allergies as of 11/25/2023   (No Known Allergies)    Family History  Problem Relation Age of Onset   Migraines Mother    Migraines Maternal Grandmother    Cancer Maternal Grandmother        throat   Diabetes Paternal Grandfather    Cancer Paternal Grandfather        blood cancer    Social History   Socioeconomic History   Marital status: Single    Spouse name: Not on file   Number of children: Not on file   Years of education: Not on file   Highest education level: Not on file  Occupational History   Not on file  Tobacco Use   Smoking status: Every Day    Types: E-cigarettes, Cigarettes   Smokeless tobacco: Never   Tobacco comments:  June 2018  Vaping Use   Vaping status: Every Day   Substances: Nicotine  Substance and Sexual Activity   Alcohol use: Yes    Alcohol/week: 0.0 standard drinks of alcohol    Comment: rare   Drug use: No   Sexual activity: Yes    Partners: Male    Birth control/protection: Pill  Other Topics Concern   Not on file  Social History Narrative   Not on file   Social Drivers of Health   Financial Resource Strain: Low Risk  (02/28/2022)   Received from [facility], [facility] visits prior to 08/29/2022.   Overall Financial Resource Strain (CARDIA)    Difficulty of Paying Living Expenses: Not very hard  Food Insecurity: No Food Insecurity  (11/25/2023)   Hunger Vital Sign    Worried About Running Out of Food in the Last Year: Never true    Ran Out of Food in the Last Year: Never true  Transportation Needs: No Transportation Needs (11/25/2023)   PRAPARE - Administrator, Civil Service (Medical): No    Lack of Transportation (Non-Medical): No  Physical Activity: Inactive (02/28/2022)   Received from [facility], [facility] visits prior to 08/29/2022.   Exercise Vital Sign    Days of Exercise per Week: 0 days    Minutes of Exercise per Session: 0 min  Stress: Stress Concern Present (02/28/2022)   Received from [facility], [facility] visits prior to 08/29/2022.   [employer] of Occupational Health - Occupational Stress Questionnaire    Feeling of Stress : To some extent  Social Connections: Moderately Integrated (11/25/2023)   Social Connection and Isolation Panel [NHANES]    Frequency of Communication with Friends and Family: More than three times a week    Frequency of Social Gatherings with Friends and Family: Three times a week    Attends Religious Services: More than 4 times per year    Active Member of Clubs or Organizations: Yes    Attends Banker Meetings: More than 4 times per year    Marital Status: Never married  Intimate Partner Violence: Not At Risk (11/25/2023)   Humiliation, Afraid, Rape, and Kick questionnaire    Fear of Current or Ex-Partner: No    Emotionally Abused: No    Physically Abused: No    Sexually Abused: No    Review of Systems: All negative except as stated above in HPI.  Physical Exam: Vital signs: Vitals:   11/27/23 1951 11/28/23 0505  BP: (!) 115/90 119/71  Pulse: 84 66  Resp: 16 18  Temp: 98 F (36.7 C) (!) 97.5 F (36.4 C)  SpO2: 99% 100%   Last BM Date : 11/27/23 General:   Alert,  Well-developed, well-nourished, pleasant and cooperative in NAD Head: normocephalic, atraumatic Eyes: anicteric sclera ENT:  oropharynx clear Neck: supple, nontender Lungs:  Clear throughout to auscultation.   No wheezes, crackles, or rhonchi. No acute distress. Heart:  Regular rate and rhythm; no murmurs, clicks, rubs,  or gallops. Abdomen: LUQ and left mid-quadrant tenderness with guarding, nontender in LLQ and right side, soft, nondistended, +BS Rectal:  Deferred Ext: no edema  GI:  Lab Results: Recent Labs    11/26/23 0529 11/27/23 0811 11/28/23 0713  WBC 3.9* 3.5* 3.9*  HGB 11.7* 13.4 13.4  HCT 35.6* 40.5 40.1  PLT 197 204 223   BMET Recent Labs    11/26/23 0529 11/28/23 9604  NA 138 136  K 3.6 3.7  CL 108 105  CO2 24 24  GLUCOSE 86 73  BUN 9 7  CREATININE 0.67 0.70  CALCIUM 8.3* 9.1   LFT Recent Labs    11/28/23 0713  PROT 7.2  ALBUMIN 4.0  AST 32  ALT 34  ALKPHOS 62  BILITOT 0.9   PT/INR No results for input(s): "LABPROT", "INR" in the last 72 hours.   Studies/Results: DG Abd Portable 1V Result Date: 11/27/2023 CLINICAL DATA:  Left lower quadrant pain.  Small-bowel obstruction. EXAM: PORTABLE ABDOMEN - 1 VIEW COMPARISON:  11/26/2023 FINDINGS: No gaseous small bowel dilatation. Gas and contrast material are seen distributed along the length of the nondilated colon. Volume of contrast in the left colon rectum has decreased in the interval. IMPRESSION: Nonobstructive bowel gas pattern. Electronically Signed   By: Donnal Fusi M.D.   On: 11/27/2023 13:40   DG Abd Portable 1V Result Date: 11/26/2023 CLINICAL DATA:  Partial small bowel obstruction. EXAM: PORTABLE ABDOMEN - 1 VIEW COMPARISON:  Abdominal x-ray 11/25/2023 FINDINGS: Oral contrast is now seen throughout nondilated colon. No dilated loops of bowel are identified peer IMPRESSION: Nonobstructive bowel gas pattern. Electronically Signed   By: Tyron Gallon M.D.   On: 11/26/2023 22:28   DG Fluoro Rm 1-60 Min - No Report Result Date: 11/26/2023 Fluoroscopy was utilized by the requesting physician.  No radiographic  interpretation.    Impression/Plan: Left-sided abdominal pain with CT showing mid-small bowel obstruction. Acute onset of symptoms makes inflammatory bowel disease less likely. Agree with repeat noncontrast CT today and if no SBO then will do EGD tomorrow. NPO p MN. Dr. Veronda Goody will do EGD tomorrow unless SBO still present. May need a colonoscopy if EGD unrevealing. Supportive care.     LOS: 3 days   Christina Kennedy  11/28/2023, 9:07 AM  Questions please call [phone]

## 2023-11-28 NOTE — Consult Note (Signed)
 Referring Provider: Dr. Sherrod Dolphin Primary Care Physician:  Patient, No Pcp Per Primary Gastroenterologist:  Para Bold  Reason for Consultation:  Abdominal pain  HPI: Christina Kennedy is a 28 y.o. female with acute onset of abdominal pain on 5/29 described at that time as LLQ pain with nausea without vomiting. No change in BMs stating that she has continued to have formed stools except following the CT contrast when she had loose stools. Denies black or red stools and denies hematemesis. CT on 5/29 showed small bowel obstruction in the LLQ thought to be mid-ileum. Initial pain was unrelated to eating and was constant. Sharp pain is intermittent now but continues to feel a dull ache constantly. Points to left side of abdomen as location of pain initially but when she had postprandial pain with clears she points to left mid-quadrant and LUQ. Had increased pain with clear liquids this weekend. Thinks she had a similar episode a year ago that did not last long. On Zepbound for past 3 months. Social alcohol. Intermittent NSAIDs. Denies family history of ulcerative colitis or Crohn's disease. Has not had a colonoscopy or EGD. Mother at bedside.   Past Medical History:  Diagnosis Date   Allergic rhinitis    seasonal   Anxiety    Chlamydia    Headache(784.0)    Infection    UTI   Urethral diverticulum     Past Surgical History:  Procedure Laterality Date   FRACTURE SURGERY     TONSILLECTOMY AND ADENOIDECTOMY  2001   TYMPANOSTOMY TUBE PLACEMENT  2001    Prior to Admission medications   Medication Sig Start Date End Date Taking? Authorizing Provider  amitriptyline  (ELAVIL ) 10 MG tablet Take 10 mg by mouth at bedtime.   Yes [provider]  celecoxib (CELEBREX) 100 MG capsule Take 100 mg by mouth 2 (two) times daily as needed for mild pain (pain score 1-3) or moderate pain (pain score 4-6). 10/28/23  Yes [provider]  gabapentin  (NEURONTIN ) 300 MG capsule Take 300 mg by mouth 2  (two) times daily.   Yes [provider]  SUMAtriptan  (IMITREX ) 50 MG tablet Take 50 mg by mouth every 2 (two) hours as needed for migraine. May repeat in 2 hours if headache persists or recurs.   Yes [provider]  ZEPBOUND 5 MG/0.5ML Pen Inject 5 mg into the skin every Friday. 10/29/23  Yes [provider]    Scheduled Meds:  amitriptyline   10 mg Oral QHS   enoxaparin  (LOVENOX ) injection  40 mg Subcutaneous Q24H   gabapentin   100 mg Oral TID   Continuous Infusions: PRN Meds:.acetaminophen , morphine  injection, ondansetron  (ZOFRAN ) IV, SUMAtriptan   Allergies as of 11/25/2023   (No Known Allergies)    Family History  Problem Relation Age of Onset   Migraines Mother    Migraines Maternal Grandmother    Cancer Maternal Grandmother        throat   Diabetes Paternal Grandfather    Cancer Paternal Grandfather        blood cancer    Social History   Socioeconomic History   Marital status: Single    Spouse name: Not on file   Number of children: Not on file   Years of education: Not on file   Highest education level: Not on file  Occupational History   Not on file  Tobacco Use   Smoking status: Every Day    Types: E-cigarettes, Cigarettes   Smokeless tobacco: Never   Tobacco comments:  June 2018  Vaping Use   Vaping status: Every Day   Substances: Nicotine  Substance and Sexual Activity   Alcohol use: Yes    Alcohol/week: 0.0 standard drinks of alcohol    Comment: rare   Drug use: No   Sexual activity: Yes    Partners: Male    Birth control/protection: Pill  Other Topics Concern   Not on file  Social History Narrative   Not on file   Social Drivers of Health   Financial Resource Strain: Low Risk  (02/28/2022)   Received from [facility], [facility] visits prior to 08/29/2022.   Overall Financial Resource Strain (CARDIA)    Difficulty of Paying Living Expenses: Not very hard  Food Insecurity: No Food Insecurity  (11/25/2023)   Hunger Vital Sign    Worried About Running Out of Food in the Last Year: Never true    Ran Out of Food in the Last Year: Never true  Transportation Needs: No Transportation Needs (11/25/2023)   PRAPARE - Administrator, Civil Service (Medical): No    Lack of Transportation (Non-Medical): No  Physical Activity: Inactive (02/28/2022)   Received from [facility], [facility] visits prior to 08/29/2022.   Exercise Vital Sign    Days of Exercise per Week: 0 days    Minutes of Exercise per Session: 0 min  Stress: Stress Concern Present (02/28/2022)   Received from [facility], [facility] visits prior to 08/29/2022.   [employer] of Occupational Health - Occupational Stress Questionnaire    Feeling of Stress : To some extent  Social Connections: Moderately Integrated (11/25/2023)   Social Connection and Isolation Panel [NHANES]    Frequency of Communication with Friends and Family: More than three times a week    Frequency of Social Gatherings with Friends and Family: Three times a week    Attends Religious Services: More than 4 times per year    Active Member of Clubs or Organizations: Yes    Attends Banker Meetings: More than 4 times per year    Marital Status: Never married  Intimate Partner Violence: Not At Risk (11/25/2023)   Humiliation, Afraid, Rape, and Kick questionnaire    Fear of Current or Ex-Partner: No    Emotionally Abused: No    Physically Abused: No    Sexually Abused: No    Review of Systems: All negative except as stated above in HPI.  Physical Exam: Vital signs: Vitals:   11/27/23 1951 11/28/23 0505  BP: (!) 115/90 119/71  Pulse: 84 66  Resp: 16 18  Temp: 98 F (36.7 C) (!) 97.5 F (36.4 C)  SpO2: 99% 100%   Last BM Date : 11/27/23 General:   Alert,  Well-developed, well-nourished, pleasant and cooperative in NAD Head: normocephalic, atraumatic Eyes: anicteric sclera ENT:  oropharynx clear Neck: supple, nontender Lungs:  Clear throughout to auscultation.   No wheezes, crackles, or rhonchi. No acute distress. Heart:  Regular rate and rhythm; no murmurs, clicks, rubs,  or gallops. Abdomen: LUQ and left mid-quadrant tenderness with guarding, nontender in LLQ and right side, soft, nondistended, +BS Rectal:  Deferred Ext: no edema  GI:  Lab Results: Recent Labs    11/26/23 0529 11/27/23 0811 11/28/23 0713  WBC 3.9* 3.5* 3.9*  HGB 11.7* 13.4 13.4  HCT 35.6* 40.5 40.1  PLT 197 204 223   BMET Recent Labs    11/26/23 0529 11/28/23 9604  NA 138 136  K 3.6 3.7  CL 108 105  CO2 24 24  GLUCOSE 86 73  BUN 9 7  CREATININE 0.67 0.70  CALCIUM 8.3* 9.1   LFT Recent Labs    11/28/23 0713  PROT 7.2  ALBUMIN 4.0  AST 32  ALT 34  ALKPHOS 62  BILITOT 0.9   PT/INR No results for input(s): "LABPROT", "INR" in the last 72 hours.   Studies/Results: DG Abd Portable 1V Result Date: 11/27/2023 CLINICAL DATA:  Left lower quadrant pain.  Small-bowel obstruction. EXAM: PORTABLE ABDOMEN - 1 VIEW COMPARISON:  11/26/2023 FINDINGS: No gaseous small bowel dilatation. Gas and contrast material are seen distributed along the length of the nondilated colon. Volume of contrast in the left colon rectum has decreased in the interval. IMPRESSION: Nonobstructive bowel gas pattern. Electronically Signed   By: Donnal Fusi M.D.   On: 11/27/2023 13:40   DG Abd Portable 1V Result Date: 11/26/2023 CLINICAL DATA:  Partial small bowel obstruction. EXAM: PORTABLE ABDOMEN - 1 VIEW COMPARISON:  Abdominal x-ray 11/25/2023 FINDINGS: Oral contrast is now seen throughout nondilated colon. No dilated loops of bowel are identified peer IMPRESSION: Nonobstructive bowel gas pattern. Electronically Signed   By: Tyron Gallon M.D.   On: 11/26/2023 22:28   DG Fluoro Rm 1-60 Min - No Report Result Date: 11/26/2023 Fluoroscopy was utilized by the requesting physician.  No radiographic  interpretation.    Impression/Plan: Left-sided abdominal pain with CT showing mid-small bowel obstruction. Acute onset of symptoms makes inflammatory bowel disease less likely. Agree with repeat noncontrast CT today and if no SBO then will do EGD tomorrow. NPO p MN. Dr. Veronda Goody will do EGD tomorrow unless SBO still present. May need a colonoscopy if EGD unrevealing. Supportive care.     LOS: 3 days   Yvetta Herbert  11/28/2023, 9:07 AM  Questions please call [phone]

## 2023-11-28 NOTE — Progress Notes (Signed)
*   Day of Surgery *   Subjective/Chief Complaint: She was having pain yesterday still. She has some dull pain in llq at rest that does not bother her.  She ate an omelet yesterday and had pain 5-10 minutes after that .  She is having flatus and bms   Objective: Vital signs in last 24 hours: Temp:  [97.5 F (36.4 C)-98.9 F (37.2 C)] 97.5 F (36.4 C) (06/01 0505) Pulse Rate:  [66-84] 66 (06/01 0505) Resp:  [15-18] 18 (06/01 0505) BP: (115-123)/(71-90) 119/71 (06/01 0505) SpO2:  [98 %-100 %] 100 % (06/01 0505) Last BM Date : 11/27/23  Intake/Output from previous day: 05/31 0701 - 06/01 0700 In: 240 [P.O.:240] Out: -  Intake/Output this shift: No intake/output data recorded.  Ab soft nontender nondistended  Lab Results:  Recent Labs    11/26/23 0529 11/27/23 0811  WBC 3.9* 3.5*  HGB 11.7* 13.4  HCT 35.6* 40.5  PLT 197 204   BMET Recent Labs    11/26/23 0529  NA 138  K 3.6  CL 108  CO2 24  GLUCOSE 86  BUN 9  CREATININE 0.67  CALCIUM 8.3*   PT/INR No results for input(s): "LABPROT", "INR" in the last 72 hours. ABG No results for input(s): "PHART", "HCO3" in the last 72 hours.  Invalid input(s): "PCO2", "PO2"  Studies/Results: DG Abd Portable 1V Result Date: 11/27/2023 CLINICAL DATA:  Left lower quadrant pain.  Small-bowel obstruction. EXAM: PORTABLE ABDOMEN - 1 VIEW COMPARISON:  11/26/2023 FINDINGS: No gaseous small bowel dilatation. Gas and contrast material are seen distributed along the length of the nondilated colon. Volume of contrast in the left colon rectum has decreased in the interval. IMPRESSION: Nonobstructive bowel gas pattern. Electronically Signed   By: Donnal Fusi M.D.   On: 11/27/2023 13:40   DG Abd Portable 1V Result Date: 11/26/2023 CLINICAL DATA:  Partial small bowel obstruction. EXAM: PORTABLE ABDOMEN - 1 VIEW COMPARISON:  Abdominal x-ray 11/25/2023 FINDINGS: Oral contrast is now seen throughout nondilated colon. No dilated loops of bowel  are identified peer IMPRESSION: Nonobstructive bowel gas pattern. Electronically Signed   By: Tyron Gallon M.D.   On: 11/26/2023 22:28   DG Fluoro Rm 1-60 Min - No Report Result Date: 11/26/2023 Fluoroscopy was utilized by the requesting physician.  No radiographic interpretation.    Anti-infectives: Anti-infectives (From admission, onward)    Start     Dose/Rate Route Frequency Ordered Stop   11/25/23 0615  cefTRIAXone  (ROCEPHIN ) 2 g in sodium chloride  0.9 % 100 mL IVPB        2 g 200 mL/hr over 30 Minutes Intravenous  Once 11/25/23 0612 11/25/23 0708       Assessment/Plan: Ab pain, ? pSBO on initial ct scan -she is not obstructed by xrays and clinically -will repeat ct today as discussed with patient -her pain occurring soon after eating is not consistent with obstruction anyways especially with bowel function -I think a GI consult would be a good idea as well.  I reviewed hospitalist notes, last 24 h vitals and pain scores, last 48 h intake and output, last 24 h labs and trends, and last 24 h imaging results.   Enid Harry 11/28/2023

## 2023-11-28 NOTE — Plan of Care (Signed)
  Problem: Education: Goal: Knowledge of General Education information will improve Description: Including pain rating scale, medication(s)/side effects and non-pharmacologic comfort measures Outcome: Progressing   Problem: Health Behavior/Discharge Planning: Goal: Ability to manage health-related needs will improve Outcome: Progressing   Problem: Clinical Measurements: Goal: Ability to maintain clinical measurements within normal limits will improve Outcome: Progressing Goal: Will remain free from infection Outcome: Progressing Goal: Diagnostic test results will improve Outcome: Progressing Goal: Respiratory complications will improve Outcome: Progressing Goal: Cardiovascular complication will be avoided Outcome: Progressing   Problem: Activity: Goal: Risk for activity intolerance will decrease Outcome: Progressing   Problem: Nutrition: Goal: Adequate nutrition will be maintained Outcome: Progressing   Problem: Elimination: Goal: Will not experience complications related to bowel motility Outcome: Progressing   Problem: Pain Managment: Goal: General experience of comfort will improve and/or be controlled Outcome: Progressing   Problem: Safety: Goal: Ability to remain free from injury will improve Outcome: Progressing
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
## 2023-11-29 ENCOUNTER — Encounter (HOSPITAL_COMMUNITY): Payer: Self-pay | Admitting: Obstetrics and Gynecology

## 2023-11-29 ENCOUNTER — Inpatient Hospital Stay (HOSPITAL_COMMUNITY): Admitting: Anesthesiology

## 2023-11-29 ENCOUNTER — Encounter (HOSPITAL_COMMUNITY)
Admission: EM | Disposition: A | Discharge status: Home / Self Care | Payer: Self-pay | Source: Home / Self Care | Attending: Internal Medicine

## 2023-11-29 DIAGNOSIS — K2900 Acute gastritis without bleeding: Secondary | ICD-10-CM | POA: Diagnosis not present

## 2023-11-29 DIAGNOSIS — E669 Obesity, unspecified: Secondary | ICD-10-CM

## 2023-11-29 DIAGNOSIS — K2970 Gastritis, unspecified, without bleeding: Secondary | ICD-10-CM | POA: Diagnosis not present

## 2023-11-29 DIAGNOSIS — K2289 Other specified disease of esophagus: Secondary | ICD-10-CM

## 2023-11-29 DIAGNOSIS — Z6833 Body mass index (BMI) 33.0-33.9, adult: Secondary | ICD-10-CM | POA: Diagnosis not present

## 2023-11-29 DIAGNOSIS — R1032 Left lower quadrant pain: Secondary | ICD-10-CM | POA: Diagnosis not present

## 2023-11-29 HISTORY — PX: ESOPHAGOGASTRODUODENOSCOPY: SHX5428

## 2023-11-29 SURGERY — EGD (ESOPHAGOGASTRODUODENOSCOPY)
Anesthesia: Monitor Anesthesia Care

## 2023-11-29 MED ORDER — PANTOPRAZOLE SODIUM 40 MG IV SOLR
40.0000 mg | Freq: Every day | INTRAVENOUS | Status: DC
  Administered 2023-11-29: 40 mg via INTRAVENOUS
  Filled 2023-11-29: qty 10

## 2023-11-29 MED ORDER — PROPOFOL 500 MG/50ML IV EMUL
INTRAVENOUS | Status: DC | PRN
  Administered 2023-11-29: 180 ug/kg/min via INTRAVENOUS

## 2023-11-29 MED ORDER — PROPOFOL 500 MG/50ML IV EMUL
INTRAVENOUS | Status: AC
Start: 2023-11-29 — End: ?
  Filled 2023-11-29: qty 100

## 2023-11-29 MED ORDER — PANTOPRAZOLE SODIUM 40 MG PO TBEC: 40.0000 mg | DELAYED_RELEASE_TABLET | Freq: Every day | ORAL | 1 refills | Status: AC

## 2023-11-29 MED ORDER — ONDANSETRON HCL 4 MG/2ML IJ SOLN
INTRAMUSCULAR | Status: DC | PRN
  Administered 2023-11-29: 4 mg via INTRAVENOUS

## 2023-11-29 MED ORDER — PROPOFOL 10 MG/ML IV BOLUS
INTRAVENOUS | Status: DC | PRN
  Administered 2023-11-29 (×5): 20 mg via INTRAVENOUS

## 2023-11-29 MED ORDER — PROPOFOL 1000 MG/100ML IV EMUL
INTRAVENOUS | Status: AC
  Filled 2023-11-29: qty 600

## 2023-11-29 MED ORDER — LIDOCAINE 2% (20 MG/ML) 5 ML SYRINGE
INTRAMUSCULAR | Status: DC | PRN
  Administered 2023-11-29: 40 mg via INTRAVENOUS

## 2023-11-29 MED ORDER — SODIUM CHLORIDE 0.9 % IV SOLN
INTRAVENOUS | Status: AC | PRN
  Administered 2023-11-29: 500 mL via INTRAMUSCULAR

## 2023-11-29 MED ORDER — ACETAMINOPHEN 325 MG PO TABS: 650.0000 mg | ORAL_TABLET | Freq: Four times a day (QID) | ORAL | Status: AC | PRN

## 2023-11-29 NOTE — Op Note (Signed)
 [facility] Patient Name: Christina Kennedy Procedure Date: 11/29/2023 MRN: [id number] Attending MD: Felecia Hopper , MD, [id number] Date of Birth: [birth year] CSN: [id number] Age: 28 Admit Type: Inpatient Procedure:                Upper GI endoscopy Indications:              Abdominal pain in the left upper quadrant Providers:                Felecia Hopper, MD, Jacquelyn "Etter Hermann, RN,                            Kimberly Penna Mbumina, Technician Referring MD:              Medicines:                Sedation Administered by an Anesthesia Professional Complications:            No immediate complications. Estimated Blood Loss:     Estimated blood loss was minimal. Procedure:                Pre-Anesthesia Assessment:                           - Prior to the procedure, a History and Physical                            was performed, and patient medications and                            allergies were reviewed. The patient's tolerance of                            previous anesthesia was also reviewed. The risks                            and benefits of the procedure and the sedation                            options and risks were discussed with the patient.                            All questions were answered, and informed consent                            was obtained. Prior Anticoagulants: The patient has                            taken no anticoagulant or antiplatelet agents. ASA                            Grade Assessment: III - A patient with severe                            systemic disease. After reviewing the risks and  benefits, the patient was deemed in satisfactory                            condition to undergo the procedure.                           After obtaining informed consent, the endoscope was                            passed under direct vision. Throughout the                            procedure, the patient's blood  pressure, pulse, and                            oxygen saturations were monitored continuously. The                            [id number]-H190 ([id number] Olympus endoscope was introduced                            through the mouth, and advanced to the second part                            of duodenum. The upper GI endoscopy was                            accomplished without difficulty. The patient                            tolerated the procedure well. Scope In: Scope Out: Findings:      The Z-line was variable and was found 37 cm from the incisors.      Scattered mild inflammation characterized by congestion (edema),       erosions and erythema was found in the entire examined stomach. Biopsies       were taken with a cold forceps for histology.      The cardia and gastric fundus were normal on retroflexion.      The duodenal bulb, first portion of the duodenum and second portion of       the duodenum were normal. Impression:               - Z-line variable, 37 cm from the incisors.                           - Gastritis. Biopsied.                           - Normal duodenal bulb, first portion of the                            duodenum and second portion of the duodenum. Moderate Sedation:      Moderate (conscious) sedation was personally administered by an       anesthesia professional. The following parameters were monitored: oxygen  saturation, heart rate, blood pressure, and response to care. Recommendation:           - Return patient to hospital ward for ongoing care.                           - Full liquid diet.                           - Continue present medications.                           - Await pathology results.                           - Return to GI clinic in 2 months. Procedure Code(s):        --- Professional ---                           [id number], Esophagogastroduodenoscopy, flexible,                            transoral; with biopsy, single or multiple Diagnosis  Code(s):        --- Professional ---                           K22.89, Other specified disease of esophagus                           K29.70, Gastritis, unspecified, without bleeding                           R10.12, Left upper quadrant pain CPT copyright 2022 [employer]. All rights reserved. The codes documented in this report are preliminary and upon coder review may  be revised to meet current compliance requirements. Felecia Hopper, MD Felecia Hopper, MD 11/29/2023 12:02:11 PM Number of Addenda: 0

## 2023-11-29 NOTE — Transfer of Care (Signed)
 Immediate Anesthesia Transfer of Care Note  Patient: Christina Kennedy  Procedure(s) Performed: EGD (ESOPHAGOGASTRODUODENOSCOPY)  Patient Location: Endoscopy Unit  Anesthesia Type:MAC  Level of Consciousness: sedated  Airway & Oxygen Therapy: Patient Spontanous Breathing and Patient connected to face mask oxygen  Post-op Assessment: Report given to RN and Post -op Vital signs reviewed and stable  Post vital signs: Reviewed and stable  Last Vitals:  Vitals Value Taken Time  BP    Temp    Pulse    Resp    SpO2      Last Pain:  Vitals:   11/29/23 1000  TempSrc: Temporal  PainSc: 4          Complications: No notable events documented.

## 2023-11-29 NOTE — Progress Notes (Signed)
 Subjective/Chief Complaint: Patient patient sitting up in bed with pain rated at 4 out of 10.  Patient remains n.p.o. pending EGD scheduled for today.  Pain remains in the left lower quadrant without radiation.  Patient has not had a bowel movement in 2 days however, she has flatus.  Objective: Vital signs in last 24 hours: Temp:  [98.2 F (36.8 C)-98.4 F (36.9 C)] 98.4 F (36.9 C) (06/02 0512) Pulse Rate:  [85-109] 85 (06/02 0512) Resp:  [16-18] 18 (06/02 0512) BP: (109-120)/(67-85) 109/67 (06/02 0512) SpO2:  [98 %-100 %] 98 % (06/02 0512) Last BM Date : 11/27/23  Intake/Output from previous day: 06/01 0701 - 06/02 0700 In: 571 [P.O.:571] Out: -  Intake/Output this shift: No intake/output data recorded.  Abd. remains soft and ND, w/o guarding or signs of peritonitis.    Lab Results:  Recent Labs    11/27/23 0811 11/28/23 0713  WBC 3.5* 3.9*  HGB 13.4 13.4  HCT 40.5 40.1  PLT 204 223   BMET Recent Labs    11/28/23 0713  NA 136  K 3.7  CL 105  CO2 24  GLUCOSE 73  BUN 7  CREATININE 0.70  CALCIUM 9.1   PT/INR No results for input(s): "LABPROT", "INR" in the last 72 hours. ABG No results for input(s): "PHART", "HCO3" in the last 72 hours.  Invalid input(s): "PCO2", "PO2"  Studies/Results: CT ABDOMEN PELVIS WO CONTRAST Result Date: 11/28/2023 CLINICAL DATA:  Abdominal pain, acute, nonlocalized EXAM: CT ABDOMEN AND PELVIS WITHOUT CONTRAST TECHNIQUE: Multidetector CT imaging of the abdomen and pelvis was performed following the standard protocol without IV contrast. RADIATION DOSE REDUCTION: This exam was performed according to the departmental dose-optimization program which includes automated exposure control, adjustment of the mA and/or kV according to patient size and/or use of iterative reconstruction technique. COMPARISON:  None Available. FINDINGS: Of note, the lack of intravenous contrast limits evaluation of the solid organ parenchyma and vascularity.  Lower chest: No focal airspace consolidation or pleural effusion. Hepatobiliary: No mass.No radiopaque stones or wall thickening of the gallbladder. No intrahepatic or extrahepatic biliary ductal dilation. Pancreas: No mass or main ductal dilation. No peripancreatic inflammation or fluid collection. Spleen: Normal size. No mass. Adrenals/Urinary Tract: No adrenal masses. No renal mass. No hydronephrosis or nephrolithiasis. The urinary bladder is distended without focal abnormality. Stomach/Bowel: The stomach is decompressed without focal abnormality. No small bowel wall thickening or inflammation. No small bowel obstruction.Enteric contrast noted within the stomach and throughout the small bowel and colon. No extravasation to suggest bowel perforation.normal appendix. Vascular/Lymphatic: No aortic aneurysm. No intraabdominal or pelvic lymphadenopathy. Reproductive: The uterus and ovaries are within normal limits for patient's age. Decreasing size of the dominant follicle in the right ovary, measuring 2.2 cm. Dominant follicle in the left ovary measuring 1.4 cm. Small volume free fluid in the pelvis. Other: No pneumoperitoneum, ascites, or mesenteric inflammation. Musculoskeletal: No acute fracture or destructive lesion. Multiple small locules of subcutaneous gas in the ventral abdominal wall, likely reflecting post-injection changes. IMPRESSION: 1. No acute intra-abdominal or pelvic abnormality. No small bowel obstruction. 2. Decreasing size of the dominant follicle in the right ovary, measuring 2.2 cm. Small volume free fluid in the pelvis, likely physiologic in a female of this age. Electronically Signed   By: Rance Burrows M.D.   On: 11/28/2023 14:09   DG Abd Portable 1V Result Date: 11/27/2023 CLINICAL DATA:  Left lower quadrant pain.  Small-bowel obstruction. EXAM: PORTABLE ABDOMEN - 1 VIEW COMPARISON:  11/26/2023  FINDINGS: No gaseous small bowel dilatation. Gas and contrast material are seen distributed  along the length of the nondilated colon. Volume of contrast in the left colon rectum has decreased in the interval. IMPRESSION: Nonobstructive bowel gas pattern. Electronically Signed   By: Donnal Fusi M.D.   On: 11/27/2023 13:40    Anti-infectives: Anti-infectives (From admission, onward)    Start     Dose/Rate Route Frequency Ordered Stop   11/25/23 0615  cefTRIAXone  (ROCEPHIN ) 2 g in sodium chloride  0.9 % 100 mL IVPB        2 g 200 mL/hr over 30 Minutes Intravenous  Once 11/25/23 0612 11/25/23 0708       Assessment/Plan: Ab pain, ? pSBO on initial ct scan -she is not obstructed by xrays and clinically -Repeat CT yesterday (6/1) remains negative for pSBO -her pain occurring soon after eating is not consistent with obstruction anyways especially with bowel function. Patient receives tylenol  prn and morphine  prn for breakthrough pain -GI consulted and EGD is scheduled for today (06/02).   I reviewed hospitalist notes, last 24 h vitals and pain scores, last 48 h intake and output, last 24 h labs and trends, and last 24 h imaging results.   Mirta Ammon, PA-C 11/29/2023

## 2023-11-29 NOTE — Anesthesia Postprocedure Evaluation (Signed)
 Anesthesia Post Note  Patient: Christina Kennedy  Procedure(s) Performed: EGD (ESOPHAGOGASTRODUODENOSCOPY)     Patient location during evaluation: PACU Anesthesia Type: MAC Level of consciousness: awake and alert Pain management: pain level controlled Vital Signs Assessment: post-procedure vital signs reviewed and stable Respiratory status: spontaneous breathing, nonlabored ventilation and respiratory function stable Cardiovascular status: blood pressure returned to baseline and stable Postop Assessment: no apparent nausea or vomiting Anesthetic complications: no   No notable events documented.  Last Vitals:  Vitals:   11/29/23 1230 11/29/23 1257  BP: 102/64 118/74  Pulse: 79 94  Resp: 16 18  Temp:  36.5 C  SpO2: 100% 100%    Last Pain:  Vitals:   11/29/23 1257  TempSrc: Oral  PainSc: 6                  Earvin Goldberg
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
## 2023-11-29 NOTE — Brief Op Note (Signed)
 11/25/2023 - 11/29/2023  12:02 PM  PATIENT:  Christina Kennedy  28 y.o. female  PRE-OPERATIVE DIAGNOSIS:  Abdominal pain  POST-OPERATIVE DIAGNOSIS:  * No post-op diagnosis entered *  PROCEDURE:  Procedure(s): EGD (ESOPHAGOGASTRODUODENOSCOPY) (N/A)  SURGEON:  Surgeons and Role:    * Felecia Hopper, MD - Primary  Findings ---------- - EGD showed mild gastritis.  Biopsies taken.  Recommendations -------------------------- - Recommend Protonix  40 mg once a day -Recommend to hold off on colonoscopy given recent concern for small bowel obstruction.  Colonoscopy prep may result in worsening symptoms -Recommend outpatient colonoscopy after 6 to 8 weeks -Slowly advance diet as tolerated. -No further inpatient GI workup planned.  GI will sign off.  Call us  back if needed.  Felecia Hopper MD, FACP 11/29/2023, 12:04 PM  Contact #  [phone]

## 2023-11-29 NOTE — Progress Notes (Signed)
 Discharge instructions gone over with [facility]. One prescription needed to be picked up at the pharmacy. Next medications due written on her discharge papers. 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 She states her pain is a 6/10, she refuses any pain medication for it and states she will have to live with it. Dr. Sherrod Dolphin notified and he came to the patient's room to speak with her. She was discharged to her husband via wheelchair.

## 2023-11-29 NOTE — Anesthesia Preprocedure Evaluation (Signed)
 Anesthesia Evaluation  Patient identified by MRN, date of birth, ID band Patient awake    Reviewed: Allergy & Precautions, H&P , NPO status , Patient's Chart, lab work & pertinent test results  Airway Mallampati: II  TM Distance: >3 FB Neck ROM: Full    Dental no notable dental hx.    Pulmonary neg pulmonary ROS, Current Smoker and Patient abstained from smoking.   Pulmonary exam normal breath sounds clear to auscultation       Cardiovascular negative cardio ROS Normal cardiovascular exam Rhythm:Regular Rate:Normal     Neuro/Psych  Headaches  Anxiety     negative neurological ROS  negative psych ROS   GI/Hepatic negative GI ROS, Neg liver ROS,,,Partial SBO   Endo/Other  negative endocrine ROS  Zepbound BMI 33  Renal/GU negative Renal ROS  negative genitourinary   Musculoskeletal negative musculoskeletal ROS (+)    Abdominal  (+) + obese  Peds negative pediatric ROS (+)  Hematology negative hematology ROS (+)   Anesthesia Other Findings   Reproductive/Obstetrics negative OB ROS                             Anesthesia Physical Anesthesia Plan  ASA: 3  Anesthesia Plan: MAC   Post-op Pain Management: Minimal or no pain anticipated   Induction: Intravenous  PONV Risk Score and Plan: 1 and Propofol infusion  Airway Management Planned: Nasal Cannula  Additional Equipment: None  Intra-op Plan:   Post-operative Plan:   Informed Consent:   Plan Discussed with:   Anesthesia Plan Comments:         Anesthesia Quick Evaluation

## 2023-11-29 NOTE — Interval H&P Note (Signed)
 History and Physical Interval Note:  11/29/2023 11:30 AM  Christina Kennedy  has presented today for surgery, with the diagnosis of Abdominal pain.  The various methods of treatment have been discussed with the patient and family. After consideration of risks, benefits and other options for treatment, the patient has consented to  Procedure(s): EGD (ESOPHAGOGASTRODUODENOSCOPY) (N/A) as a surgical intervention.  The patient's history has been reviewed, patient examined, no change in status, stable for surgery.  I have reviewed the patient's chart and labs.  Questions were answered to the patient's satisfaction.     [name]

## 2023-11-29 NOTE — Discharge Instructions (Signed)

## 2023-11-29 NOTE — Plan of Care (Signed)
  Problem: Health Behavior/Discharge Planning: Goal: Ability to manage health-related needs will improve Outcome: Progressing   Problem: Clinical Measurements: Goal: Ability to maintain clinical measurements within normal limits will improve Outcome: Progressing   Problem: Education: Goal: Knowledge of General Education information will improve Description: Including pain rating scale, medication(s)/side effects and non-pharmacologic comfort measures Outcome: Progressing   

## 2023-11-30 ENCOUNTER — Encounter (HOSPITAL_COMMUNITY): Payer: Self-pay | Admitting: Gastroenterology

## 2023-11-30 LAB — SURGICAL PATHOLOGY

## 2024-01-24 NOTE — Telephone Encounter (Signed)
 Patient 5-6 preg, having ab pain (4 out of 10) and some clotting that just started today. Concerned if need to be seen. Req CB

## 2024-01-24 NOTE — Telephone Encounter (Signed)
 Call returned, no answer, lmtcb.

## 2024-01-25 ENCOUNTER — Other Ambulatory Visit: Payer: Self-pay

## 2024-01-25 ENCOUNTER — Inpatient Hospital Stay (HOSPITAL_COMMUNITY)
Admission: AD | Admit: 2024-01-25 | Discharge: 2024-01-25 | Disposition: A | Discharge status: Home / Self Care | Payer: PRIVATE HEALTH INSURANCE | Attending: Obstetrics and Gynecology | Admitting: Obstetrics and Gynecology

## 2024-01-25 ENCOUNTER — Inpatient Hospital Stay (HOSPITAL_COMMUNITY): Payer: PRIVATE HEALTH INSURANCE

## 2024-01-25 ENCOUNTER — Encounter (HOSPITAL_COMMUNITY): Payer: Self-pay | Admitting: Obstetrics and Gynecology

## 2024-01-25 DIAGNOSIS — O208 Other hemorrhage in early pregnancy: Secondary | ICD-10-CM | POA: Insufficient documentation

## 2024-01-25 DIAGNOSIS — O26891 Other specified pregnancy related conditions, first trimester: Secondary | ICD-10-CM | POA: Diagnosis not present

## 2024-01-25 DIAGNOSIS — B9689 Other specified bacterial agents as the cause of diseases classified elsewhere: Secondary | ICD-10-CM

## 2024-01-25 DIAGNOSIS — Z3A01 Less than 8 weeks gestation of pregnancy: Secondary | ICD-10-CM

## 2024-01-25 DIAGNOSIS — O23591 Infection of other part of genital tract in pregnancy, first trimester: Secondary | ICD-10-CM | POA: Insufficient documentation

## 2024-01-25 DIAGNOSIS — O418X10 Other specified disorders of amniotic fluid and membranes, first trimester, not applicable or unspecified: Secondary | ICD-10-CM

## 2024-01-25 LAB — URINALYSIS, ROUTINE W REFLEX MICROSCOPIC
Bilirubin Urine: NEGATIVE
Glucose, UA: NEGATIVE mg/dL
Ketones, ur: NEGATIVE mg/dL
Nitrite: NEGATIVE
Protein, ur: 30 mg/dL — AB
Specific Gravity, Urine: 1.016 (ref 1.005–1.030)
pH: 6 (ref 5.0–8.0)

## 2024-01-25 LAB — CBC
HCT: 37.1 % (ref 36.0–46.0)
Hemoglobin: 12.6 g/dL (ref 12.0–15.0)
MCH: 29.7 pg (ref 26.0–34.0)
MCHC: 34 g/dL (ref 30.0–36.0)
MCV: 87.5 fL (ref 80.0–100.0)
Platelets: 213 K/uL (ref 150–400)
RBC: 4.24 MIL/uL (ref 3.87–5.11)
RDW: 13.6 % (ref 11.5–15.5)
WBC: 5.5 K/uL (ref 4.0–10.5)
nRBC: 0 % (ref 0.0–0.2)

## 2024-01-25 LAB — HCG, QUANTITATIVE, PREGNANCY: hCG, Beta Chain, Quant, S: 70712 m[IU]/mL — ABNORMAL HIGH (ref <5)

## 2024-01-25 LAB — WET PREP, GENITAL
Sperm: NONE SEEN
Trich, Wet Prep: NONE SEEN
WBC, Wet Prep HPF POC: <10 (ref <10)
Yeast Wet Prep HPF POC: NONE SEEN

## 2024-01-25 MED ORDER — METRONIDAZOLE 500 MG PO TABS: 500.0000 mg | ORAL_TABLET | Freq: Two times a day (BID) | ORAL | 0 refills | Status: AC

## 2024-01-25 NOTE — MAU Note (Signed)
 Christina Kennedy is a 28 y.o. at Unknown here in MAU reporting: she's been light VB for past three days and also lower abdominal cramping.  Reports last intercourse two days ago.  Reports to make sure everything is alright.  LMP: 12/17/2023 Onset of complaint: 3 days ago Pain score: 4 Vitals:   01/25/24 0811  BP: (!) 100/55  Pulse: 94  Resp: 18  Temp: 98.5 F (36.9 C)  SpO2: 100%     FHT: NA  Lab orders placed from triage: UA

## 2024-01-25 NOTE — Discharge Instructions (Addendum)
 We also found bacteria on your vaginal swab, please take the metronidazole  twice a day for 7 days.   [facility] [employer] for [employer] at [employer] for [facility]    Phone: [phone]  Center for [employer] at [place]   Phone: [phone]  [facility] for [employer] at [place]  Phone: [phone]  [facility] for [employer] at [employer]  Phone: [phone]  [facility] for [employer] at [place]  Phone: [phone]  [facility] at [facility]   Phone: [phone]  [facility]       Phone: [phone]  [facility]    Phone: [phone]   [facility]    Phone: [phone]  [facility]    Phone: [phone]  [facility]    Phone: [phone]  [facility] Department-Family Planning       Phone: [phone]   [facility] Department-Maternity  Phone: [phone]  [facility]    Phone: [phone]  [facility] [place]   Phone: [phone]  [facility]      Phone: [phone]  [facility]    Phone: [phone]

## 2024-01-25 NOTE — MAU Provider Note (Cosign Needed Addendum)
 Chief Complaint: Abdominal Pain and Vaginal Bleeding   Event Date/Time   First Provider Initiated Contact with Patient 01/25/24 [phone]      SUBJECTIVE HPI: Christina Kennedy is a 28 y.o. G3P1001 at [redacted]w[redacted]d by LMP who presents to maternity admissions reporting vaginal bleeding and abdominal cramping. Vaginal bleeding started two days ago in the morning accompanied with intermittent stabbing over dull pain below the umbilicus. Bleeding is less than a period, used 2-3 panty liners/day with thin strip of blood. Reports severity is 4/10, constant non radiating pain, different from period cramps. Currently managing migraines well with Amitriptyline  and Sumatriptan  PRN which she hasn't needed. Was previously on Zepbound but d/c'd after confirmation of pregnancy.  She denies vaginal itching/burning, urinary symptoms, h/a, dizziness, n/v, or fever/chills.    Established [facility] with [facility] [address] in [place]  Past Medical History:  Diagnosis Date   Allergic rhinitis    seasonal   Anxiety    Chlamydia    Headache(784.0)    Infection    UTI   Urethral diverticulum    Past Surgical History:  Procedure Laterality Date   ESOPHAGOGASTRODUODENOSCOPY N/A 11/29/2023   Procedure: EGD (ESOPHAGOGASTRODUODENOSCOPY);  Surgeon: Elicia Claw, MD;  Location: [facility];  Service: Gastroenterology;  Laterality: N/A;   FRACTURE SURGERY     TONSILLECTOMY AND ADENOIDECTOMY  2001   TYMPANOSTOMY TUBE PLACEMENT  2001   Social History   Socioeconomic History   Marital status: Single    Spouse name: Not on file   Number of children: Not on file   Years of education: Not on file   Highest education level: Not on file  Occupational History   Not on file  Tobacco Use   Smoking status: Every Day    Types: E-cigarettes, Cigarettes   Smokeless tobacco: Never   Tobacco comments:    June 2018  Vaping Use   Vaping status: Every Day   Substances: Nicotine  Substance and Sexual Activity    Alcohol use: Yes    Alcohol/week: 0.0 standard drinks of alcohol    Comment: rare   Drug use: No   Sexual activity: Yes    Partners: Male    Birth control/protection: Pill  Other Topics Concern   Not on file  Social History Narrative   Not on file   Social Drivers of Health   Financial Resource Strain: Low Risk  (02/28/2022)   Received from [facility], [facility] visits prior to 08/29/2022.   Overall Financial Resource Strain (CARDIA)    Difficulty of Paying Living Expenses: Not very hard  Food Insecurity: No Food Insecurity (11/25/2023)   Hunger Vital Sign    Worried About Running Out of Food in the Last Year: Never true    Ran Out of Food in the Last Year: Never true  Transportation Needs: No Transportation Needs (11/25/2023)   PRAPARE - Administrator, Civil Service (Medical): No    Lack of Transportation (Non-Medical): No  Physical Activity: Inactive (02/28/2022)   Received from [facility], [facility] visits prior to 08/29/2022.   Exercise Vital Sign    On average, how many days per week do you engage in moderate to strenuous exercise (like a brisk walk)?: 0 days    On average, how many minutes do you engage in exercise at this level?: 0 min  Stress: Stress Concern Present (02/28/2022)   [facility] visits prior  to 08/29/2022.   [employer] of Occupational Health - Occupational Stress Questionnaire    Feeling of Stress : To some extent  Social Connections: Moderately Integrated (11/25/2023)   Social Connection and Isolation Panel    Frequency of Communication with Friends and Family: More than three times a week    Frequency of Social Gatherings with Friends and Family: Three times a week    Attends Religious Services: More than 4 times per year    Active Member of Clubs or Organizations: Yes    Attends Banker Meetings: More than 4 times per year     Marital Status: Never married  Intimate Partner Violence: Not At Risk (11/25/2023)   Humiliation, Afraid, Rape, and Kick questionnaire    Fear of Current or Ex-Partner: No    Emotionally Abused: No    Physically Abused: No    Sexually Abused: No   No current facility-administered medications on file prior to encounter.   Current Outpatient Medications on File Prior to Encounter  Medication Sig Dispense Refill   acetaminophen  (TYLENOL ) 325 MG tablet Take 2 tablets (650 mg total) by mouth every 6 (six) hours as needed for moderate pain (pain score 4-6) or mild pain (pain score 1-3).     amitriptyline  (ELAVIL ) 10 MG tablet Take 10 mg by mouth at bedtime.     gabapentin  (NEURONTIN ) 300 MG capsule Take 300 mg by mouth 2 (two) times daily.     pantoprazole  (PROTONIX ) 40 MG tablet Take 1 tablet (40 mg total) by mouth daily. 30 tablet 1   SUMAtriptan  (IMITREX ) 50 MG tablet Take 50 mg by mouth every 2 (two) hours as needed for migraine. May repeat in 2 hours if headache persists or recurs.     No Known Allergies  ROS:  Pertinent positives/negatives listed above.  I have reviewed patient's Past Medical Hx, Surgical Hx, Family Hx, Social Hx, medications and allergies.   Physical Exam  Patient Vitals for the past 24 hrs:  BP Temp Temp src Pulse Resp SpO2 Height Weight  01/25/24 0811 (!) 100/55 98.5 F (36.9 C) Oral 94 18 100 % -- --  01/25/24 0806 -- -- -- -- -- -- 5' 1 (1.549 m) 71 kg   Constitutional: Well-developed, well-nourished female in no acute distress.  Cardiovascular: normal rate Respiratory: normal effort GI: Abd soft, non-tender. Pos BS x 4 MS: Extremities nontender, no edema, normal ROM Neurologic: Alert and oriented x 4.  GU: Neg CVAT.   LAB RESULTS Results for orders placed or performed during the hospital encounter of 01/25/24 (from the past 24 hours)  Wet prep, genital     Status: Abnormal   Collection Time: 01/25/24  8:24 AM   Specimen: PATH Cytology Cervicovaginal  Ancillary Only  Result Value Ref Range   Yeast Wet Prep HPF POC NONE SEEN NONE SEEN   Trich, Wet Prep NONE SEEN NONE SEEN   Clue Cells Wet Prep HPF POC PRESENT (A) NONE SEEN   WBC, Wet Prep HPF POC <10 <10   Sperm NONE SEEN   CBC     Status: None   Collection Time: 01/25/24  8:26 AM  Result Value Ref Range   WBC 5.5 4.0 - 10.5 K/uL   RBC 4.24 3.87 - 5.11 MIL/uL   Hemoglobin 12.6 12.0 - 15.0 g/dL   HCT 62.8 63.9 - 53.9 %   MCV 87.5 80.0 - 100.0 fL   MCH 29.7 26.0 - 34.0 pg   MCHC 34.0 30.0 - 36.0  g/dL   RDW 86.3 88.4 - 84.4 %   Platelets 213 150 - 400 K/uL   nRBC 0.0 0.0 - 0.2 %  hCG, quantitative, pregnancy     Status: Abnormal   Collection Time: 01/25/24  8:26 AM  Result Value Ref Range   hCG, Beta Chain, Quant, S 70,712 (H) <5 mIU/mL  Urinalysis, Routine w reflex microscopic -Urine, Clean Catch     Status: Abnormal   Collection Time: 01/25/24  8:43 AM  Result Value Ref Range   Color, Urine AMBER (A) YELLOW   APPearance CLOUDY (A) CLEAR   Specific Gravity, Urine 1.016 1.005 - 1.030   pH 6.0 5.0 - 8.0   Glucose, UA NEGATIVE NEGATIVE mg/dL   Hgb urine dipstick LARGE (A) NEGATIVE   Bilirubin Urine NEGATIVE NEGATIVE   Ketones, ur NEGATIVE NEGATIVE mg/dL   Protein, ur 30 (A) NEGATIVE mg/dL   Nitrite NEGATIVE NEGATIVE   Leukocytes,Ua SMALL (A) NEGATIVE   RBC / HPF 0-5 0 - 5 RBC/hpf   WBC, UA 11-20 0 - 5 WBC/hpf   Bacteria, UA FEW (A) NONE SEEN   Squamous Epithelial / HPF 21-50 0 - 5 /HPF   Mucus PRESENT        IMAGING US  OB LESS THAN 14 WEEKS WITH OB TRANSVAGINAL Result Date: 01/25/2024 CLINICAL DATA:  [id number] Vaginal spotting [id number]. EXAM: OBSTETRIC <14 WK US  AND TRANSVAGINAL OB US  TECHNIQUE: Both transabdominal and transvaginal ultrasound examinations were performed for complete evaluation of the gestation as well as the maternal uterus, adnexal regions, and pelvic cul-de-sac. Transvaginal technique was performed to assess early pregnancy. COMPARISON:  None Available.  FINDINGS: Intrauterine gestational sac: Single Yolk sac:  Visualized. Embryo:  Visualized. Cardiac Activity: Visualized. Heart Rate: 116 bpm CRL: 1.5 mm, which is too small to calculate the expected gestational age. Subchorionic hemorrhage: There is a very small subchorionic hemorrhage measured 4 x 6 mm, which occupies less than 10% of the circumference of the gestational sac. Maternal uterus/adnexae: Right ovary is visualized and appears within normal limits. There is a 2.1 x 2.3 x 2.6 cm simple cyst in the left ovary. IMPRESSION: *Single intrauterine gestational sac with very small embryo with crown-rump length of 1.5 mm, which is too small to calculate the expected gestational age. Follow-up examination is recommended in 7-14 days to document satisfactory progression of pregnancy. *Very small subchorionic hemorrhage occupying less than 10% of the circumference of the gestational sac. Electronically Signed   By: Ree Molt M.D.   On: 01/25/2024 09:03    MDM: Subchorionic hematoma: Ectopic rule-out given unconfirmed location of early pregnancy with vaginal bleeding and moderate abdominal pain. CBC unremarkable, Hcg ~70,000 consistent with LMP, A+ blood type, US  found IUP too small for gestational age dating, as well as small subchorionic hematoma. Ovarian torsion unlikely given location of pain and visualized ovaries on US , though cyst of L ovary noted.   Bacterial Vaginosis: Confirmed on wet prep self swab, no other symptoms besides bleeding, though may be contributing to bleeding/pain. Urinalysis found Large Hgb, 30 protein, small leukocytes, and few bacteria, likely not a very clean catch with 21-50 squamous cells, given large Hgb and protein sent for culture because of abdominal pain to rule out UTI.  MAU Management: Orders Placed This Encounter  Procedures   Wet prep, genital   Culture, OB Urine   US  OB LESS THAN 14 WEEKS WITH OB TRANSVAGINAL   Urinalysis, Routine w reflex microscopic -Urine,  Clean Catch   CBC   hCG,  quantitative, pregnancy   Discharge patient Discharge disposition: 01-Home or Self Care; Discharge patient date: 01/25/2024    Meds ordered this encounter  Medications   metroNIDAZOLE  (FLAGYL ) 500 MG tablet    Sig: Take 1 tablet (500 mg total) by mouth 2 (two) times daily for 7 days.    Dispense:  14 tablet    Refill:  0   ASSESSMENT 1. Subchorionic hematoma in first trimester, single or unspecified fetus   2. [redacted] weeks gestation of pregnancy   3. Bacterial vaginosis     PLAN Discharge home with strict return precautions. -Pending urine culture, will contact if abnormal and treat -Metronidazole  500 mg BID for 7 days for BV -Handouts given regarding bleeding in 1st trimester and subchorionic hematomas -[facility]  Allergies as of 01/25/2024   No Known Allergies      Medication List     STOP taking these medications    Zepbound 5 MG/0.5ML Pen Generic drug: tirzepatide       TAKE these medications    acetaminophen  325 MG tablet Commonly known as: TYLENOL  Take 2 tablets (650 mg total) by mouth every 6 (six) hours as needed for moderate pain (pain score 4-6) or mild pain (pain score 1-3).   amitriptyline  10 MG tablet Commonly known as: ELAVIL  Take 10 mg by mouth at bedtime.   gabapentin  300 MG capsule Commonly known as: NEURONTIN  Take 300 mg by mouth 2 (two) times daily.   metroNIDAZOLE  500 MG tablet Commonly known as: FLAGYL  Take 1 tablet (500 mg total) by mouth 2 (two) times daily for 7 days.   pantoprazole  40 MG tablet Commonly known as: Protonix  Take 1 tablet (40 mg total) by mouth daily.   SUMAtriptan  50 MG tablet Commonly known as: IMITREX  Take 50 mg by mouth every 2 (two) hours as needed for migraine. May repeat in 2 hours if headache persists or recurs.        Follow-up Information     [facility] Follow up.   Specialty: Obstetrics and Gynecology Why: As needed for  emergencies Contact information: [address] [place]  [address] [phone]                Fairy Amy, MD [facility] Family Medicine Resident, PGY-1 01/25/2024  10:05 AM   Attestation of Supervision of Student:  I confirm that I have verified the information documented in the Resident Physician's  note and that I have also personally reperformed the history, physical exam and all medical decision making activities.  I have verified that all services and findings are accurately documented in this student's note; and I agree with management and plan as outlined in the documentation. I have also made any necessary editorial changes.   Olam DELENA Dalton, NP Center for [employer], [facility] 01/25/2024 3:58 PM

## 2024-01-26 LAB — GC/CHLAMYDIA PROBE AMP (~~LOC~~) NOT AT ARMC
Chlamydia: NEGATIVE
Comment: NEGATIVE
Comment: NORMAL
Neisseria Gonorrhea: NEGATIVE

## 2024-01-27 LAB — CULTURE, OB URINE: Culture: >=100000 — AB

## 2024-01-28 ENCOUNTER — Ambulatory Visit: Payer: Self-pay | Admitting: Obstetrics and Gynecology

## 2024-01-28 MED ORDER — CEPHALEXIN 500 MG PO CAPS
500.0000 mg | ORAL_CAPSULE | Freq: Four times a day (QID) | ORAL | 2 refills | Status: DC
Start: 2024-01-28 — End: 2024-02-21

## 2024-02-21 ENCOUNTER — Inpatient Hospital Stay (HOSPITAL_COMMUNITY): Payer: PRIVATE HEALTH INSURANCE

## 2024-02-21 ENCOUNTER — Encounter (HOSPITAL_COMMUNITY): Payer: Self-pay | Admitting: Obstetrics and Gynecology

## 2024-02-21 ENCOUNTER — Inpatient Hospital Stay (HOSPITAL_COMMUNITY)
Admission: AD | Admit: 2024-02-21 | Discharge: 2024-02-21 | Disposition: A | Discharge status: Home / Self Care | Payer: PRIVATE HEALTH INSURANCE | Attending: Obstetrics and Gynecology | Admitting: Obstetrics and Gynecology

## 2024-02-21 DIAGNOSIS — Z3A09 9 weeks gestation of pregnancy: Secondary | ICD-10-CM | POA: Diagnosis not present

## 2024-02-21 DIAGNOSIS — O208 Other hemorrhage in early pregnancy: Secondary | ICD-10-CM | POA: Diagnosis present

## 2024-02-21 DIAGNOSIS — O418X10 Other specified disorders of amniotic fluid and membranes, first trimester, not applicable or unspecified: Secondary | ICD-10-CM

## 2024-02-21 DIAGNOSIS — R109 Unspecified abdominal pain: Secondary | ICD-10-CM | POA: Diagnosis not present

## 2024-02-21 DIAGNOSIS — O26891 Other specified pregnancy related conditions, first trimester: Secondary | ICD-10-CM | POA: Diagnosis not present

## 2024-02-21 DIAGNOSIS — O209 Hemorrhage in early pregnancy, unspecified: Secondary | ICD-10-CM

## 2024-02-21 HISTORY — DX: Unspecified ovarian cyst, unspecified side: N83.209

## 2024-02-21 HISTORY — DX: Urinary tract infection, site not specified: N39.0

## 2024-02-21 LAB — COMPREHENSIVE METABOLIC PANEL WITH GFR
ALT: 10 U/L (ref 0–44)
AST: 18 U/L (ref 15–41)
Albumin: 3.6 g/dL (ref 3.5–5.0)
Alkaline Phosphatase: 47 U/L (ref 38–126)
Anion gap: 12 (ref 5–15)
BUN: 7 mg/dL (ref 6–20)
CO2: 19 mmol/L — ABNORMAL LOW (ref 22–32)
Calcium: 9.8 mg/dL (ref 8.9–10.3)
Chloride: 104 mmol/L (ref 98–111)
Creatinine, Ser: 0.66 mg/dL (ref 0.44–1.00)
GFR, Estimated: >60 mL/min (ref >60)
Glucose, Bld: 97 mg/dL (ref 70–99)
Potassium: 3.3 mmol/L — ABNORMAL LOW (ref 3.5–5.1)
Sodium: 135 mmol/L (ref 135–145)
Total Bilirubin: 0.7 mg/dL (ref 0.0–1.2)
Total Protein: 7.3 g/dL (ref 6.5–8.1)

## 2024-02-21 LAB — WET PREP, GENITAL
Clue Cells Wet Prep HPF POC: NONE SEEN
Sperm: NONE SEEN
Trich, Wet Prep: NONE SEEN
WBC, Wet Prep HPF POC: >=10 — AB (ref <10)
Yeast Wet Prep HPF POC: NONE SEEN

## 2024-02-21 LAB — CBC
HCT: 36.2 % (ref 36.0–46.0)
Hemoglobin: 12.5 g/dL (ref 12.0–15.0)
MCH: 30.5 pg (ref 26.0–34.0)
MCHC: 34.5 g/dL (ref 30.0–36.0)
MCV: 88.3 fL (ref 80.0–100.0)
Platelets: 239 K/uL (ref 150–400)
RBC: 4.1 MIL/uL (ref 3.87–5.11)
RDW: 13.7 % (ref 11.5–15.5)
WBC: 6.8 K/uL (ref 4.0–10.5)
nRBC: 0 % (ref 0.0–0.2)

## 2024-02-21 LAB — HCG, QUANTITATIVE, PREGNANCY: hCG, Beta Chain, Quant, S: >250000 m[IU]/mL — ABNORMAL HIGH (ref <5)

## 2024-02-21 MED ORDER — ONDANSETRON 8 MG PO TBDP: 8.0000 mg | ORAL_TABLET | Freq: Three times a day (TID) | ORAL | 0 refills | Status: AC | PRN

## 2024-02-21 NOTE — MAU Provider Note (Signed)
 Chief Complaint:  Abdominal Pain   HPI   None     Christina Kennedy is a 28 y.o. [id number] at [redacted]w[redacted]d who presents to maternity admissions reporting abdominal pain.  She endorses heavy vaginal bleeding 2 days ago with cramping all day as well.  Yesterday, still had cramping and nausea but decreased vaginal bleeding.  Today she is not bleeding but still has cramping and nausea..  She was previously evaluated in the MAU on 01/25/2024 for vaginal bleeding and abdominal cramping.  Workup at that time significant for BV, ultrasound confirmed IUP with growth sac, yolk sac, fetal pole, cardiac activity visualized.  Subchorionic hematoma was also identified on ultrasound.  Urine culture also showed a UTI and antibiotics were sent on 01/28/2024.  Initial prenatal appointment is scheduled with [facility] with [facility].  Pregnancy Course: Receives care at [facility]. No prenatal visits yet, see above for previous MAU visit.  Past Medical History:  Diagnosis Date   Allergic rhinitis    seasonal   Anxiety    Chlamydia    Fracture of left leg 10/2022   Headache(784.0)    Infection    UTI   Ovarian cyst    Urethral diverticulum    UTI (urinary tract infection)    OB History  Gravida Para Term Preterm AB Living  4 2 1 1 1 3   SAB IAB Ectopic Multiple Live Births   1  1 3     # Outcome Date GA Lbr Len/2nd Weight Sex Type Anes PTL Lv  4 Current           3 IAB 09/2022          2 Preterm 02/28/22    M Vag-Spont   LIV     Complications: Twin birth  1 Term 08/06/17 [redacted]w[redacted]d / 01:19 3080 g F Vag-Spont EPI  LIV    Obstetric Comments  2024 procedure   Past Surgical History:  Procedure Laterality Date   ESOPHAGOGASTRODUODENOSCOPY N/A 11/29/2023   Procedure: EGD (ESOPHAGOGASTRODUODENOSCOPY);  Surgeon: Elicia Claw, MD;  Location: [facility];  Service: Gastroenterology;  Laterality: N/A;   FRACTURE SURGERY     THERAPEUTIC ABORTION  2024   TONSILLECTOMY AND ADENOIDECTOMY  2001   TYMPANOSTOMY TUBE  PLACEMENT  2001   Family History  Problem Relation Age of Onset   Migraines Mother    Healthy Father    Migraines Maternal Grandmother    Cancer Maternal Grandmother        throat   Diabetes Paternal Grandfather    Cancer Paternal Grandfather        blood cancer   Social History   Tobacco Use   Smoking status: Former    Types: E-cigarettes, Cigarettes   Smokeless tobacco: Never   Tobacco comments:    June 2018  Vaping Use   Vaping status: Former   Substances: Nicotine  Substance Use Topics   Alcohol use: Not Currently    Comment: rare   Drug use: No   No Known Allergies Medications Prior to Admission  Medication Sig Dispense Refill Last Dose/Taking   amitriptyline  (ELAVIL ) 10 MG tablet Take 10 mg by mouth at bedtime.   02/20/2024 Bedtime   pantoprazole  (PROTONIX ) 40 MG tablet Take 1 tablet (40 mg total) by mouth daily. 30 tablet 1 02/20/2024 Morning   Prenatal Vit-Fe Fumarate-FA (MULTIVITAMIN-PRENATAL) 27-0.8 MG TABS tablet Take 1 tablet by mouth daily at 12 noon.   02/21/2024 Morning   Ubrogepant (UBRELVY) 100 MG TABS Take 100 mg  by mouth daily as needed.   02/20/2024 at  2:00 PM   acetaminophen  (TYLENOL ) 325 MG tablet Take 2 tablets (650 mg total) by mouth every 6 (six) hours as needed for moderate pain (pain score 4-6) or mild pain (pain score 1-3).   More than a month    I have reviewed patient's Past Medical Hx, Surgical Hx, Family Hx, Social Hx, medications and allergies.   ROS  Pertinent items noted in HPI and remainder of comprehensive ROS otherwise negative.   PHYSICAL EXAM  Patient Vitals for the past 24 hrs:  BP Temp Temp src Pulse SpO2 Height Weight  02/21/24 1108 112/73 -- -- 72 -- -- --  02/21/24 0827 116/60 99.2 F (37.3 C) Oral 100 100 % 5' 1 (1.549 m) 71.5 kg    Constitutional: Well-developed, well-nourished female in no acute distress.  HEENT: atraumatic, normocephalic. Neck has normal ROM. EOM intact. Cardiovascular: normal rate & rhythm, warm and  well-perfused Respiratory: normal effort, no problems with respiration noted GI: Abd soft, non-tender, non-distended MSK: Extremities nontender, no edema, normal ROM Skin: warm and dry. Acyanotic, no jaundice or pallor. Neurologic: Alert and oriented x 4. No abnormal coordination. Psychiatric: Normal mood. Speech not slurred, not rapid/pressured. Patient is cooperative. GU: no CVA tenderness Pelvic: deferred  Labs: Results for orders placed or performed during the hospital encounter of 02/21/24 (from the past 24 hours)  Comprehensive metabolic panel     Status: Abnormal   Collection Time: 02/21/24  8:33 AM  Result Value Ref Range   Sodium 135 135 - 145 mmol/L   Potassium 3.3 (L) 3.5 - 5.1 mmol/L   Chloride 104 98 - 111 mmol/L   CO2 19 (L) 22 - 32 mmol/L   Glucose, Bld 97 70 - 99 mg/dL   BUN 7 6 - 20 mg/dL   Creatinine, Ser 9.33 0.44 - 1.00 mg/dL   Calcium 9.8 8.9 - 89.6 mg/dL   Total Protein 7.3 6.5 - 8.1 g/dL   Albumin 3.6 3.5 - 5.0 g/dL   AST 18 15 - 41 U/L   ALT 10 0 - 44 U/L   Alkaline Phosphatase 47 38 - 126 U/L   Total Bilirubin 0.7 0.0 - 1.2 mg/dL   GFR, Estimated >39 >39 mL/min   Anion gap 12 5 - 15  CBC     Status: None   Collection Time: 02/21/24  8:33 AM  Result Value Ref Range   WBC 6.8 4.0 - 10.5 K/uL   RBC 4.10 3.87 - 5.11 MIL/uL   Hemoglobin 12.5 12.0 - 15.0 g/dL   HCT 63.7 63.9 - 53.9 %   MCV 88.3 80.0 - 100.0 fL   MCH 30.5 26.0 - 34.0 pg   MCHC 34.5 30.0 - 36.0 g/dL   RDW 86.2 88.4 - 84.4 %   Platelets 239 150 - 400 K/uL   nRBC 0.0 0.0 - 0.2 %  hCG, quantitative, pregnancy     Status: Abnormal   Collection Time: 02/21/24  8:33 AM  Result Value Ref Range   hCG, Beta Chain, Quant, S >250,000 (H) <5 mIU/mL  Wet prep, genital     Status: Abnormal   Collection Time: 02/21/24  8:58 AM  Result Value Ref Range   Yeast Wet Prep HPF POC NONE SEEN NONE SEEN   Trich, Wet Prep NONE SEEN NONE SEEN   Clue Cells Wet Prep HPF POC NONE SEEN NONE SEEN   WBC, Wet  Prep HPF POC >=10 (A) <10   Sperm  NONE SEEN     Imaging:  US  OB Transvaginal Result Date: 02/21/2024 CLINICAL DATA:  Heavy vaginal bleeding. EXAM: TRANSVAGINAL OB ULTRASOUND TECHNIQUE: Transvaginal ultrasound was performed for complete evaluation of the gestation as well as the maternal uterus, adnexal regions, and pelvic cul-de-sac. COMPARISON:  01/25/2024. FINDINGS: Intrauterine gestational sac: Single. Yolk sac:  Present. Embryo:  Present. Cardiac Activity: Present. Heart Rate: 179 bpm CRL:   27.7 mm   9 w 4 d                  US  EDC: 09/21/2024. Subchorionic hemorrhage:  Small, measuring 7 x 10 mm. Maternal uterus/adnexae: Right ovary not visualized. Corpus luteum cyst in the left ovary. No free fluid. IMPRESSION: 1. Single living intrauterine pregnancy with gestational age [redacted] weeks 4 days and estimated date of confinement 09/21/2024. 2. Small subchorionic hemorrhage. Electronically Signed   By: Newell Eke M.D.   On: 02/21/2024 10:54    MDM & MAU COURSE  MDM: High  MAU Course: -Vital signs within normal limits. -CBC, US , hCG, blood type for ectopic rule out.  -UA and wet prep to rule out infection.  -CMP with mild hypokalemia, sending prescription for PO potassium supplement. -UA and wet prep negative for infection. -Personally reviewed images, agree with radiology interpretation. US  with IUGS, YS, FP, and cardiac activity. Evidence of subchorionic hematoma, likely source of bleeding.  Differential diagnosis considered for 1st trimester vaginal bleeding includes but is not limited to: ectopic pregnancy, complete spontaneous abortion, incomplete abortion, missed abortion, threatened abortion, embryonic/fetal demise, cervical insufficiency, cervical or vaginal disorder   Orders Placed This Encounter  Procedures   Wet prep, genital   US  OB Transvaginal   Comprehensive metabolic panel   CBC   hCG, quantitative, pregnancy   Discharge patient   Meds ordered this encounter   Medications   ondansetron  (ZOFRAN -ODT) 8 MG disintegrating tablet    Sig: Take 1 tablet (8 mg total) by mouth every 8 (eight) hours as needed for nausea or vomiting.    Dispense:  20 tablet    Refill:  0    ASSESSMENT   1. Abdominal pain during pregnancy in first trimester   2. Vaginal bleeding in pregnancy, first trimester   3. Subchorionic hematoma in first trimester, single or unspecified fetus   4. [redacted] weeks gestation of pregnancy     PLAN  Discharge home in stable condition with bleeding precautions.  Discussed subchorionic hematoma and US  findings of IUP. May take Tylenol  prn for pain if needed. Follow up with OB as scheduled.     Allergies as of 02/21/2024   No Known Allergies      Medication List     TAKE these medications    acetaminophen  325 MG tablet Commonly known as: TYLENOL  Take 2 tablets (650 mg total) by mouth every 6 (six) hours as needed for moderate pain (pain score 4-6) or mild pain (pain score 1-3).   amitriptyline  10 MG tablet Commonly known as: ELAVIL  Take 10 mg by mouth at bedtime.   multivitamin-prenatal 27-0.8 MG Tabs tablet Take 1 tablet by mouth daily at 12 noon.   ondansetron  8 MG disintegrating tablet Commonly known as: ZOFRAN -ODT Take 1 tablet (8 mg total) by mouth every 8 (eight) hours as needed for nausea or vomiting.   pantoprazole  40 MG tablet Commonly known as: Protonix  Take 1 tablet (40 mg total) by mouth daily.   Ubrelvy 100 MG Tabs Generic drug: Ubrogepant Take 100 mg by mouth daily as  needed.        Joesph DELENA Sear, PA

## 2024-02-21 NOTE — Discharge Instructions (Signed)
 Reasons to return to MAU at [facility]: If you have heavier bleeding that soaks through more that 2 pads per hour for an hour or more If you bleed so much that you feel like you might pass out or you do pass out If you have significant abdominal pain that is not improved with Tylenol  1000 mg every 8 hours as needed for pain If you develop a fever > 100.5

## 2024-02-21 NOTE — MAU Note (Addendum)
 Christina Kennedy is a 28 y.o. at [redacted]w[redacted]d here in MAU reporting: Saturday morning, woke up covered in blood.  Cramped and bled all day.  Yesterday still had cramping, nausea and vomiting.  Not much blood.  Cramping, nausea and vomiting continue today. No bleeding.  Pretty sure she miscarried, had to put on pads and change regularly.   Onset of complaint: Saturday morning Pain score: mod/severe Vitals:   02/21/24 0827  BP: 116/60  Pulse: 100  Temp: 99.2 F (37.3 C)  SpO2: 100%      Lab orders placed from triage:  blood work drawn while in triage

## 2024-02-22 LAB — GC/CHLAMYDIA PROBE AMP (~~LOC~~) NOT AT ARMC
Chlamydia: NEGATIVE
Comment: NEGATIVE
Comment: NORMAL
Neisseria Gonorrhea: NEGATIVE

## 2024-06-26 ENCOUNTER — Emergency Department (HOSPITAL_COMMUNITY)
Admission: EM | Admit: 2024-06-26 | Discharge: 2024-06-26 | Disposition: A | Discharge status: Home / Self Care | Payer: PRIVATE HEALTH INSURANCE | Attending: Emergency Medicine | Admitting: Emergency Medicine

## 2024-06-26 ENCOUNTER — Emergency Department (HOSPITAL_COMMUNITY): Payer: PRIVATE HEALTH INSURANCE

## 2024-06-26 ENCOUNTER — Encounter (HOSPITAL_COMMUNITY): Payer: Self-pay | Admitting: *Deleted

## 2024-06-26 ENCOUNTER — Other Ambulatory Visit: Payer: Self-pay

## 2024-06-26 DIAGNOSIS — N12 Tubulo-interstitial nephritis, not specified as acute or chronic: Secondary | ICD-10-CM | POA: Diagnosis not present

## 2024-06-26 DIAGNOSIS — O26891 Other specified pregnancy related conditions, first trimester: Secondary | ICD-10-CM | POA: Insufficient documentation

## 2024-06-26 DIAGNOSIS — Z3A01 Less than 8 weeks gestation of pregnancy: Secondary | ICD-10-CM | POA: Diagnosis not present

## 2024-06-26 DIAGNOSIS — Z3490 Encounter for supervision of normal pregnancy, unspecified, unspecified trimester: Secondary | ICD-10-CM

## 2024-06-26 LAB — I-STAT CHEM 8, ED
BUN: 10 mg/dL (ref 6–20)
Calcium, Ion: 1.19 mmol/L (ref 1.15–1.40)
Chloride: 106 mmol/L (ref 98–111)
Creatinine, Ser: 0.6 mg/dL (ref 0.44–1.00)
Glucose, Bld: 78 mg/dL (ref 70–99)
HCT: 40 % (ref 36.0–46.0)
Hemoglobin: 13.6 g/dL (ref 12.0–15.0)
Potassium: 3.1 mmol/L — ABNORMAL LOW (ref 3.5–5.1)
Sodium: 141 mmol/L (ref 135–145)
TCO2: 22 mmol/L (ref 22–32)

## 2024-06-26 LAB — CBC WITH DIFFERENTIAL/PLATELET
Abs Immature Granulocytes: 0.02 K/uL (ref 0.00–0.07)
Basophils Absolute: 0 K/uL (ref 0.0–0.1)
Basophils Relative: 0 %
Eosinophils Absolute: 0.2 K/uL (ref 0.0–0.5)
Eosinophils Relative: 3 %
HCT: 38.8 % (ref 36.0–46.0)
Hemoglobin: 13.3 g/dL (ref 12.0–15.0)
Immature Granulocytes: 0 %
Lymphocytes Relative: 16 %
Lymphs Abs: 1 K/uL (ref 0.7–4.0)
MCH: 30 pg (ref 26.0–34.0)
MCHC: 34.3 g/dL (ref 30.0–36.0)
MCV: 87.6 fL (ref 80.0–100.0)
Monocytes Absolute: 0.3 K/uL (ref 0.1–1.0)
Monocytes Relative: 4 %
Neutro Abs: 4.9 K/uL (ref 1.7–7.7)
Neutrophils Relative %: 77 %
Platelets: 222 K/uL (ref 150–400)
RBC: 4.43 MIL/uL (ref 3.87–5.11)
RDW: 13.5 % (ref 11.5–15.5)
WBC: 6.3 K/uL (ref 4.0–10.5)
nRBC: 0 % (ref 0.0–0.2)

## 2024-06-26 LAB — URINALYSIS, ROUTINE W REFLEX MICROSCOPIC
Bilirubin Urine: NEGATIVE
Glucose, UA: NEGATIVE mg/dL
Hgb urine dipstick: NEGATIVE
Ketones, ur: 20 mg/dL — AB
Nitrite: NEGATIVE
Protein, ur: 30 mg/dL — AB
Specific Gravity, Urine: 1.019 (ref 1.005–1.030)
WBC, UA: >50 WBC/hpf (ref 0–5)
pH: 6 (ref 5.0–8.0)

## 2024-06-26 LAB — COMPREHENSIVE METABOLIC PANEL WITH GFR
ALT: 20 U/L (ref 0–44)
AST: 20 U/L (ref 15–41)
Albumin: 4.7 g/dL (ref 3.5–5.0)
Alkaline Phosphatase: 68 U/L (ref 38–126)
Anion gap: 12 (ref 5–15)
BUN: 10 mg/dL (ref 6–20)
CO2: 20 mmol/L — ABNORMAL LOW (ref 22–32)
Calcium: 9.8 mg/dL (ref 8.9–10.3)
Chloride: 107 mmol/L (ref 98–111)
Creatinine, Ser: 0.63 mg/dL (ref 0.44–1.00)
GFR, Estimated: >60 mL/min
Glucose, Bld: 78 mg/dL (ref 70–99)
Potassium: 3.3 mmol/L — ABNORMAL LOW (ref 3.5–5.1)
Sodium: 139 mmol/L (ref 135–145)
Total Bilirubin: 0.6 mg/dL (ref 0.0–1.2)
Total Protein: 7.7 g/dL (ref 6.5–8.1)

## 2024-06-26 LAB — HCG, QUANTITATIVE, PREGNANCY: hCG, Beta Chain, Quant, S: 4872 m[IU]/mL — ABNORMAL HIGH

## 2024-06-26 LAB — HCG, SERUM, QUALITATIVE: Preg, Serum: POSITIVE — AB

## 2024-06-26 LAB — LIPASE, BLOOD: Lipase: 28 U/L (ref 11–51)

## 2024-06-26 MED ORDER — ONDANSETRON 4 MG PO TBDP
4.0000 mg | ORAL_TABLET | Freq: Once | ORAL | Status: AC
  Administered 2024-06-26: 4 mg via ORAL
  Filled 2024-06-26: qty 1

## 2024-06-26 MED ORDER — CEPHALEXIN 500 MG PO CAPS: 500.0000 mg | ORAL_CAPSULE | Freq: Four times a day (QID) | ORAL | 0 refills | Status: AC

## 2024-06-26 MED ORDER — ACETAMINOPHEN 325 MG PO TABS
650.0000 mg | ORAL_TABLET | Freq: Once | ORAL | Status: AC
  Administered 2024-06-26: 650 mg via ORAL
  Filled 2024-06-26: qty 2

## 2024-06-26 MED ORDER — CEFTRIAXONE SODIUM 1 G IJ SOLR
1.0000 g | Freq: Once | INTRAMUSCULAR | Status: AC
  Administered 2024-06-26: 1 g via INTRAVENOUS
  Filled 2024-06-26: qty 10

## 2024-06-26 MED ORDER — OXYCODONE-ACETAMINOPHEN 5-325 MG PO TABS
1.0000 | ORAL_TABLET | Freq: Once | ORAL | Status: AC
  Administered 2024-06-26: 1 via ORAL
  Filled 2024-06-26: qty 1

## 2024-06-26 NOTE — ED Provider Triage Note (Signed)
 Emergency Medicine Provider Triage Evaluation Note  Christina Kennedy , a 28 y.o. female  was evaluated in triage.  Pt complains of L flank pain that radiates to abdomen x 1 week, worsening yesterday. Low-grade fever taking tylenol , also has chills. Vomiting x 3 yesterday with nausea. Urinary frequency, bladder pressure, as well as burning with urination started yesterday.   Review of Systems  Positive: Urinary pressure/frequency, L flank pain, abdominal, nausea, vomiting, fever Negative: Chest pain, shortness of breath  Physical Exam  BP (!) 136/97 (BP Location: Right Arm)   Pulse (!) 129   Temp 99.5 F (37.5 C) (Oral)   Resp 18   Ht 5' (1.524 m)   Wt 68 kg   LMP 12/17/2023 (Approximate)   SpO2 100%   BMI 29.29 kg/m  Gen:   Awake, no distress   Resp:  Normal effort  MSK:   Moves extremities without difficulty  Other:  Abdomen is soft, mild bladder tenderness, L CVA tenderness  Medical Decision Making  Medically screening exam initiated at 7:31 AM.  Appropriate orders placed.  Christina Kennedy was informed that the remainder of the evaluation will be completed by another provider, this initial triage assessment does not replace that evaluation, and the importance of remaining in the ED until their evaluation is complete.  Orders: CBC, CMP, Lipase, UA, pregnancy, Ct abdomen pelvis with contrast, chem 8, tylenol , zofran    Christina Kennedy, [place] 06/26/24 [phone]

## 2024-06-26 NOTE — Discharge Instructions (Addendum)
 You were seen in the emergency department for left flank pain.  Your urinalysis shows some signs of infection and you were given an IV dose of antibiotics.  Please fill your prescription and start your antibiotics tomorrow.  Your pregnancy test was positive.  Your ultrasound was too early to determine the location of the pregnancy.  It will be important for you to follow-up with your OB team.  Your hCG was [id number].  This number will usually double every 2 or 3 days in a normal pregnancy.  You will need to get it rechecked with your OB team.

## 2024-06-26 NOTE — ED Provider Notes (Signed)
 "  EMERGENCY DEPARTMENT AT [facility] Provider Note   CSN: [id number] Arrival date & time: 06/26/24  9298     Patient presents with: Flank Pain   Christina Kennedy is a 28 y.o. female.  She is here with complaint of left flank left upper and lower quadrant pain that is been going on for about a week.  No known trauma no prior history.  Associated with some nausea.  Since yesterday has had some urinary frequency.  No vaginal bleeding or discharge.  No fevers chills.  Has tried nothing for her symptoms.  Last menstrual period was about 4 weeks ago.   The history is provided by the patient.  Flank Pain This is a new problem. The current episode started more than 2 days ago. The problem occurs constantly. The problem has not changed since onset.Associated symptoms include abdominal pain. Pertinent negatives include no chest pain, no headaches and no shortness of breath. Nothing aggravates the symptoms. Nothing relieves the symptoms. She has tried nothing for the symptoms. The treatment provided no relief.       Prior to Admission medications  Medication Sig Start Date End Date Taking? Authorizing Provider  acetaminophen  (TYLENOL ) 325 MG tablet Take 2 tablets (650 mg total) by mouth every 6 (six) hours as needed for moderate pain (pain score 4-6) or mild pain (pain score 1-3). 11/29/23   Hongalgi, Trenda BIRCH, MD  amitriptyline  (ELAVIL ) 10 MG tablet Take 10 mg by mouth at bedtime.    [provider]  ondansetron  (ZOFRAN -ODT) 8 MG disintegrating tablet Take 1 tablet (8 mg total) by mouth every 8 (eight) hours as needed for nausea or vomiting. 02/21/24   Wallace Joesph LABOR, PA  pantoprazole  (PROTONIX ) 40 MG tablet Take 1 tablet (40 mg total) by mouth daily. 11/29/23 02/21/24  Hongalgi, Anand D, MD  Prenatal Vit-Fe Fumarate-FA (MULTIVITAMIN-PRENATAL) 27-0.8 MG TABS tablet Take 1 tablet by mouth daily at 12 noon.    [provider]  Ubrogepant (UBRELVY) 100 MG TABS Take 100  mg by mouth daily as needed.    [provider]    Allergies: Patient has no known allergies.    Review of Systems  Respiratory:  Negative for shortness of breath.   Cardiovascular:  Negative for chest pain.  Gastrointestinal:  Positive for abdominal pain.  Genitourinary:  Positive for flank pain.  Neurological:  Negative for headaches.    Updated Vital Signs BP (!) 136/97 (BP Location: Right Arm)   Pulse (!) 129   Temp 99.5 F (37.5 C) (Oral)   Resp 18   Ht 5' (1.524 m)   Wt 68 kg   LMP 12/17/2023 (Approximate)   SpO2 100%   BMI 29.29 kg/m   Physical Exam Vitals and nursing note reviewed.  Constitutional:      General: She is not in acute distress.    Appearance: Normal appearance. She is well-developed.  HENT:     Head: Normocephalic and atraumatic.  Eyes:     Conjunctiva/sclera: Conjunctivae normal.  Cardiovascular:     Rate and Rhythm: Normal rate and regular rhythm.     Heart sounds: No murmur heard. Pulmonary:     Effort: Pulmonary effort is normal. No respiratory distress.     Breath sounds: Normal breath sounds.  Abdominal:     Palpations: Abdomen is soft.     Tenderness: There is no abdominal tenderness. There is no guarding or rebound.  Musculoskeletal:        General: No  swelling.     Cervical back: Neck supple.  Skin:    General: Skin is warm and dry.     Capillary Refill: Capillary refill takes less than 2 seconds.  Neurological:     General: No focal deficit present.     Mental Status: She is alert.     Sensory: No sensory deficit.     Motor: No weakness.     (all labs ordered are listed, but only abnormal results are displayed) Labs Reviewed  COMPREHENSIVE METABOLIC PANEL WITH GFR - Abnormal; Notable for the following components:      Result Value   Potassium 3.3 (*)    CO2 20 (*)    All other components within normal limits  URINALYSIS, ROUTINE W REFLEX MICROSCOPIC - Abnormal; Notable for the following components:   Color,  Urine AMBER (*)    APPearance CLOUDY (*)    Ketones, ur 20 (*)    Protein, ur 30 (*)    Leukocytes,Ua MODERATE (*)    Bacteria, UA MANY (*)    All other components within normal limits  HCG, SERUM, QUALITATIVE - Abnormal; Notable for the following components:   Preg, Serum POSITIVE (*)    All other components within normal limits  HCG, QUANTITATIVE, PREGNANCY - Abnormal; Notable for the following components:   hCG, Beta Chain, Quant, S 4,872 (*)    All other components within normal limits  I-STAT CHEM 8, ED - Abnormal; Notable for the following components:   Potassium 3.1 (*)    All other components within normal limits  URINE CULTURE  LIPASE, BLOOD  CBC WITH DIFFERENTIAL/PLATELET  ABO/RH    EKG: None  Radiology: US  OB Comp < 14 Wks Result Date: 06/26/2024 EXAM: ULTRASOUND FIRST TRIMESTER TECHNIQUE: Transabdominal and Transvaginal first trimester obstetric pelvic duplex ultrasound was performed with real-time imaging, color flow Doppler imaging, and spectral analysis. COMPARISON: None available. CLINICAL HISTORY: and pain preg and pain preg. Gestational age by last menstrual period. 3 weeks and 3 days. Estimated due date by last menstrual: 03/09/2025. Last menstrual: 12/05. FINDINGS: UTERUS: No focal myometrial mass. GESTATIONAL SAC(S): Single intrauterine gestational sac identified. No subchorionic hemorrhage. YOLK SAC: No yolk sac. EMBRYO(<11WK) /FETUS(>=11WK): No embryo. CROWN RUMP LENGTH: Not measured. RATE OF CARDIAC ACTIVITY: No cardiac activity. RIGHT OVARY: There are two thick-walled cystic lesion within the right ovary measuring 1.1cm and 1.9cm. LEFT OVARY: The left ovary is unremarkable. FREE FLUID: No free fluid. MEASUREMENTS ESTIMATED GESTATIONAL AGE BY CURRENT ULTRASOUND: 5 weeks and 2 days (based on mean sac diameter of 6.4 mm). ESTIMATED GESTATIONAL AGE BY LMP/PRIOR ULTRASOUND: 3 weeks and 3 days (based on last menstrual period). ESTIMATED DUE DATE: 03/09/2025 (based on  last menstrual period). IMPRESSION: 1. A definite intrauterine pregnancy or ectopic pregnancy are not established with this ultrasound. Recommend trending of beta-hcg and repeat pelvic ultrasound in 7 to 10 days for further evaluation (earlier if clinically indicated). 2. Possible single intrauterine gestational sac without yolk sac or embryo. Size of sac correlate to a gestational age of [redacted] weeks and 2 days which is nonconrdant with gestational age by last menstrual period of 3 weeks and 3days. 3. Two thick-walled cystic lesion within the right ovary measuring 1.1cm and 1.9cm. findings may represent a corpus luteum cyst versus ectopic pregnancy. Electronically signed by: Morgane Naveau MD 06/26/2024 02:15 PM EST RP Workstation: [id number]   US  OB Transvaginal Result Date: 06/26/2024 EXAM: ULTRASOUND FIRST TRIMESTER TECHNIQUE: Transabdominal and Transvaginal first trimester obstetric pelvic duplex ultrasound was performed  with real-time imaging, color flow Doppler imaging, and spectral analysis. COMPARISON: None available. CLINICAL HISTORY: and pain preg and pain preg. Gestational age by last menstrual period. 3 weeks and 3 days. Estimated due date by last menstrual: 03/09/2025. Last menstrual: 12/05. FINDINGS: UTERUS: No focal myometrial mass. GESTATIONAL SAC(S): Single intrauterine gestational sac identified. No subchorionic hemorrhage. YOLK SAC: No yolk sac. EMBRYO(<11WK) /FETUS(>=11WK): No embryo. CROWN RUMP LENGTH: Not measured. RATE OF CARDIAC ACTIVITY: No cardiac activity. RIGHT OVARY: There are two thick-walled cystic lesion within the right ovary measuring 1.1cm and 1.9cm. LEFT OVARY: The left ovary is unremarkable. FREE FLUID: No free fluid. MEASUREMENTS ESTIMATED GESTATIONAL AGE BY CURRENT ULTRASOUND: 5 weeks and 2 days (based on mean sac diameter of 6.4 mm). ESTIMATED GESTATIONAL AGE BY LMP/PRIOR ULTRASOUND: 3 weeks and 3 days (based on last menstrual period). ESTIMATED DUE DATE: 03/09/2025 (based on  last menstrual period). IMPRESSION: 1. A definite intrauterine pregnancy or ectopic pregnancy are not established with this ultrasound. Recommend trending of beta-hcg and repeat pelvic ultrasound in 7 to 10 days for further evaluation (earlier if clinically indicated). 2. Possible single intrauterine gestational sac without yolk sac or embryo. Size of sac correlate to a gestational age of [redacted] weeks and 2 days which is nonconrdant with gestational age by last menstrual period of 3 weeks and 3days. 3. Two thick-walled cystic lesion within the right ovary measuring 1.1cm and 1.9cm. findings may represent a corpus luteum cyst versus ectopic pregnancy. Electronically signed by: Morgane Naveau MD 06/26/2024 02:15 PM EST RP Workstation: [id number]     Procedures   Medications Ordered in the ED  ondansetron  (ZOFRAN -ODT) disintegrating tablet 4 mg (4 mg Oral Given 06/26/24 0901)  acetaminophen  (TYLENOL ) tablet 650 mg (650 mg Oral Given 06/26/24 0900)  cefTRIAXone  (ROCEPHIN ) 1 g in sodium chloride  0.9 % 100 mL IVPB (0 g Intravenous Stopped 06/26/24 1536)  oxyCODONE -acetaminophen  (PERCOCET/ROXICET) 5-325 MG per tablet 1 tablet (1 tablet Oral Given 06/26/24 1527)    Clinical Course as of 06/26/24 1724  Mon Jun 26, 2024  1439 Reviewed results of workup with patient.  Will give her an IV dose of antibiotics.  She has an OB to follow-up with. [MB]  1439   She understands having an infection puts her at high risk for miscarriage [MB]    Clinical Course User Index [MB] Towana Ozell BROCKS, MD                                 Medical Decision Making Amount and/or Complexity of Data Reviewed Labs: ordered. Radiology: ordered.  Risk Prescription drug management.   This patient complains of left flank pain and frequent urination; this involves an extensive number of treatment Options and is a complaint that carries with it a high risk of complications and morbidity. The differential includes renal colic,  pyelonephritis, musculoskeletal  I ordered, reviewed and interpreted labs, which included pregnancy test positive, normal white count normal renal function, urinalysis concerning for infection sent for culture I ordered medication IV antibiotics, nausea medication pain medicine and reviewed PMP when indicated. I ordered imaging studies which included pelvic ultrasound and I independently    visualized and interpreted imaging which showed no definite IUP Previous records obtained and reviewed in epic including outpatient OB notes Cardiac monitoring reviewed, sinus rhythm Social determinants considered, physically inactive stress Critical Interventions: None  After the interventions stated above, I reevaluated the patient and found patient to be well-appearing  and hemodynamically stable Admission and further testing considered, she is comfortable plan for discharge and outpatient follow-up with her OB team.  Will need repeat imaging and lab work.  Given IV antibiotics and will send home on oral antibiotics.  Patient understands increased risk of fetal loss with infection and need for close follow-up.  She is comfortable plan for discharge.      Final diagnoses:  Pyelonephritis  Early stage of pregnancy    ED Discharge Orders          Ordered    cephALEXin  (KEFLEX ) 500 MG capsule  4 times daily        06/26/24 1441               Towana Ozell BROCKS, MD 06/26/24 1726  "

## 2024-06-26 NOTE — ED Triage Notes (Signed)
 C/o left flank pain with radiation into left lower abd. C/o nausea , c/o chills onset of sx. Julie Craw urinary freq.with pressure and burning

## 2024-06-27 LAB — ABO/RH: ABO/RH(D): A POS

## 2024-06-30 LAB — URINE CULTURE: Culture: >=100000 — AB

## 2024-07-01 ENCOUNTER — Telehealth (HOSPITAL_BASED_OUTPATIENT_CLINIC_OR_DEPARTMENT_OTHER): Payer: Self-pay | Admitting: *Deleted

## 2024-07-01 NOTE — Telephone Encounter (Signed)
 Post ED Visit - Positive Culture Follow-up: Successful Patient Follow-Up  Culture assessed and recommendations reviewed by:  []  Rankin Dee, Pharm.D. []  Venetia Gully, Pharm.D., BCPS AQ-ID []  Garrel Crews, Pharm.D., BCPS []  Almarie Lunger, Pharm.D., BCPS []  Edwards, [address].D., BCPS, AAHIVP []  Rosaline Bihari, Pharm.D., BCPS, AAHIVP []  Vernell Meier, PharmD, BCPS []  Latanya Hint, PharmD, BCPS []  Donald Medley, PharmD, BCPS [x]  Dorn Buttner,  PharmD  Positive urine culture  []  Patient discharged without antimicrobial prescription and treatment is now indicated [x]  Organism is resistant to prescribed ED discharge antimicrobial []  Patient with positive blood cultures  Changes discussed with ED provider: Bernardino Fireman, MD New antibiotic prescription Ciprofloxacin 500mg  BID x 7 days (qty 14, refills 0) Advised pt to Stop Keflex , notify OB/GYN immediately, and return to ED if symptoms do not improve. Pt understands Called to [facility], [address] [place], [place]  Contacted patient, date 07/01/24, time 1424   Christina Kennedy 07/01/2024, 2:25 PM

## 2024-07-01 NOTE — Progress Notes (Signed)
 ED Antimicrobial Stewardship Positive Culture Follow Up   Christina Kennedy is an 29 y.o. female who presented to [facility] on @ADMITDT @ with a chief complaint of  Chief Complaint  Patient presents with   Flank Pain    Recent Results (from the past 720 hours)  Urine Culture     Status: Abnormal   Collection Time: 06/26/24  1:15 PM   Specimen: Urine, Clean Catch  Result Value Ref Range Status   Specimen Description URINE, CLEAN CATCH  Final   Special Requests   Final    NONE Performed at [facility], [address] [place], [place] [address]    Culture >=100,000 COLONIES/mL STAPHYLOCOCCUS SAPROPHYTICUS (A)  Final   Report Status 06/30/2024 FINAL  Final   Organism ID, Bacteria STAPHYLOCOCCUS SAPROPHYTICUS (A)  Final      Susceptibility   Staphylococcus saprophyticus - MIC*    CIPROFLOXACIN <=0.5 SENSITIVE Sensitive     GENTAMICIN <=0.5 SENSITIVE Sensitive     NITROFURANTOIN <=16 SENSITIVE Sensitive     OXACILLIN >=4 RESISTANT Resistant     TETRACYCLINE <=1 SENSITIVE Sensitive     VANCOMYCIN 1 SENSITIVE Sensitive     TRIMETH /SULFA  <=10 SENSITIVE Sensitive     RIFAMPIN <=0.5 SENSITIVE Sensitive     Inducible Clindamycin NEGATIVE Sensitive     * >=100,000 COLONIES/mL STAPHYLOCOCCUS SAPROPHYTICUS    [x]  Treated with Keflex , organism resistant to prescribed antimicrobial. Patient's urine culture grew staphylococcus saprophyticus, resistant to oxacillin. ED provider from initial encounter diagnosed w/ pyelonephritis.  Given patient's status of early pregnancy this results in limited available appropriate treatment options, especially for treating pyelonephritis.  The two most appropriate available outpatient treatment options given literature review would be that of ciprofloxacin or linezolid (Alternative would be returning for possible inpatient treatment). After careful consideration with discussions involving ID pharmacist and Women's and Children's pharmacist, I have  recommended ciprofloxacin for the treatment of this patient's diagnosed pyelonephritis in early stage pregnancy. As linezolid's performance in the treatment of pyelonephritis is not as well documented as that of ciprofloxacin per ID pharmacist's recommendation.  This was discussed with MD Melvenia, who is in agreement with plan below.  STOP Keflex  START: Ciprofloxacin 500mg  BID x 7 days (Qty 14; Refills 0) Notify OB/GYN immediately Return to ED if symptoms do not improve/resolve  ED Provider: Melvenia Dorn Buttner, PharmD, BCPS 07/01/2024 12:06 PM ED Clinical Pharmacist -  [phone]
